# Patient Record
Sex: Male | Born: 1970 | Race: White | Hispanic: No | Marital: Married | Smoking: Current every day smoker
Health system: Southern US, Community
[De-identification: ages and names within clinical notes are randomized; demographics above are authoritative.]

## PROBLEM LIST (undated history)

## (undated) DIAGNOSIS — N200 Calculus of kidney: Secondary | ICD-10-CM

## (undated) DIAGNOSIS — G4733 Obstructive sleep apnea (adult) (pediatric): Secondary | ICD-10-CM

## (undated) DIAGNOSIS — I10 Essential (primary) hypertension: Secondary | ICD-10-CM

## (undated) HISTORY — PX: TONSILLECTOMY AND ADENOIDECTOMY: SHX28

## (undated) HISTORY — DX: Essential (primary) hypertension: I10

## (undated) HISTORY — DX: Obstructive sleep apnea (adult) (pediatric): G47.33

---

## 2003-12-31 ENCOUNTER — Emergency Department (HOSPITAL_COMMUNITY): Admission: EM | Admit: 2003-12-31 | Discharge: 2003-12-31 | Payer: Self-pay | Admitting: Emergency Medicine

## 2005-08-07 ENCOUNTER — Ambulatory Visit: Payer: Self-pay | Admitting: Family Medicine

## 2005-08-07 ENCOUNTER — Ambulatory Visit (HOSPITAL_COMMUNITY): Admission: RE | Admit: 2005-08-07 | Discharge: 2005-08-07 | Payer: Self-pay | Admitting: Family Medicine

## 2005-10-12 ENCOUNTER — Ambulatory Visit: Payer: Self-pay | Admitting: Family Medicine

## 2005-10-20 ENCOUNTER — Ambulatory Visit: Payer: Self-pay | Admitting: Family Medicine

## 2006-05-24 ENCOUNTER — Ambulatory Visit: Payer: Self-pay | Admitting: Cardiovascular Disease

## 2006-05-24 ENCOUNTER — Ambulatory Visit: Payer: Self-pay | Admitting: Family Medicine

## 2006-05-26 ENCOUNTER — Ambulatory Visit (HOSPITAL_COMMUNITY): Admission: RE | Admit: 2006-05-26 | Discharge: 2006-05-27 | Payer: Self-pay | Admitting: Urology

## 2006-07-08 ENCOUNTER — Ambulatory Visit (HOSPITAL_COMMUNITY): Admission: AD | Admit: 2006-07-08 | Discharge: 2006-07-08 | Payer: Self-pay | Admitting: Urology

## 2006-07-14 ENCOUNTER — Observation Stay (HOSPITAL_COMMUNITY): Admission: RE | Admit: 2006-07-14 | Discharge: 2006-07-15 | Payer: Self-pay | Admitting: Urology

## 2007-03-12 ENCOUNTER — Emergency Department (HOSPITAL_COMMUNITY): Admission: EM | Admit: 2007-03-12 | Discharge: 2007-03-12 | Payer: Self-pay | Admitting: Emergency Medicine

## 2007-03-13 ENCOUNTER — Ambulatory Visit: Payer: Self-pay | Admitting: Family Medicine

## 2007-06-23 ENCOUNTER — Ambulatory Visit: Payer: Self-pay | Admitting: Family Medicine

## 2007-06-23 DIAGNOSIS — H9319 Tinnitus, unspecified ear: Secondary | ICD-10-CM | POA: Insufficient documentation

## 2007-06-23 DIAGNOSIS — Z87442 Personal history of urinary calculi: Secondary | ICD-10-CM

## 2007-07-03 ENCOUNTER — Ambulatory Visit: Payer: Self-pay | Admitting: Family Medicine

## 2008-10-10 ENCOUNTER — Ambulatory Visit: Payer: Self-pay | Admitting: Family Medicine

## 2008-10-10 DIAGNOSIS — M79609 Pain in unspecified limb: Secondary | ICD-10-CM

## 2009-01-28 ENCOUNTER — Ambulatory Visit: Payer: Self-pay | Admitting: Family Medicine

## 2009-01-28 DIAGNOSIS — J069 Acute upper respiratory infection, unspecified: Secondary | ICD-10-CM

## 2009-04-21 ENCOUNTER — Emergency Department (HOSPITAL_COMMUNITY): Admission: EM | Admit: 2009-04-21 | Discharge: 2009-04-21 | Payer: Self-pay | Admitting: Emergency Medicine

## 2009-11-06 ENCOUNTER — Encounter (INDEPENDENT_AMBULATORY_CARE_PROVIDER_SITE_OTHER): Payer: Self-pay | Admitting: *Deleted

## 2009-11-29 HISTORY — PX: KIDNEY STONE SURGERY: SHX686

## 2010-01-29 ENCOUNTER — Emergency Department (HOSPITAL_COMMUNITY): Admission: EM | Admit: 2010-01-29 | Discharge: 2010-01-30 | Payer: Self-pay | Admitting: Emergency Medicine

## 2010-06-08 ENCOUNTER — Telehealth: Payer: Self-pay | Admitting: Family Medicine

## 2010-06-22 ENCOUNTER — Ambulatory Visit (HOSPITAL_COMMUNITY): Admission: RE | Admit: 2010-06-22 | Discharge: 2010-06-23 | Payer: Self-pay | Admitting: Urology

## 2010-06-22 ENCOUNTER — Encounter: Payer: Self-pay | Admitting: Urology

## 2010-12-29 NOTE — Progress Notes (Signed)
Summary: Pt has cough, chest congestion, sorethroat. Req med called in  Phone Note Call from Patient Call back at Home Phone [phone]   Caller: Patient Summary of Call: Pt called and said that he has a cough, chest congestion, sorethroat. Pt req an ov or med called in to [facility] on [address] and [address].   Initial call taken by: Lucy Antigua,  June 08, 2010 9:05 AM  Follow-up for Phone Call        Pt called back to ask about prescription to be called to [facility] ([address]) [phone] Follow-up by: Lynann Beaver CMA,  June 08, 2010 4:56 PM  Additional Follow-up for Phone Call Additional follow up Details #1::        call in a [employer] Additional Follow-up by: Nelwyn Salisbury MD,  June 09, 2010 9:41 AM    Additional Follow-up for Phone Call Additional follow up Details #2::    Rx called. Follow-up by: Raechel Ache, RN,  June 09, 2010 9:57 AM

## 2011-02-13 LAB — TYPE AND SCREEN
ABO/RH(D): O NEG
Antibody Screen: NEGATIVE

## 2011-02-13 LAB — BASIC METABOLIC PANEL
BUN: 14 mg/dL (ref 6–23)
CO2: 30 mEq/L (ref 19–32)
Chloride: 99 mEq/L (ref 96–112)
Glucose, Bld: 109 mg/dL — ABNORMAL HIGH (ref 70–99)
Potassium: 2.9 mEq/L — ABNORMAL LOW (ref 3.5–5.1)
Sodium: 138 mEq/L (ref 135–145)

## 2011-02-13 LAB — CBC
HCT: 41.3 % (ref 39.0–52.0)
Hemoglobin: 14.5 g/dL (ref 13.0–17.0)
MCV: 86.6 fL (ref 78.0–100.0)
RBC: 4.77 MIL/uL (ref 4.22–5.81)
WBC: 6.1 10*3/uL (ref 4.0–10.5)

## 2011-02-13 LAB — STONE ANALYSIS: Stone Weight KSTONE: 0.181 g

## 2011-03-09 LAB — URINE MICROSCOPIC-ADD ON

## 2011-03-09 LAB — URINALYSIS, ROUTINE W REFLEX MICROSCOPIC
Glucose, UA: NEGATIVE mg/dL
Ketones, ur: NEGATIVE mg/dL
Leukocytes, UA: NEGATIVE
Protein, ur: NEGATIVE mg/dL
pH: 5.5 (ref 5.0–8.0)

## 2011-04-16 NOTE — Op Note (Signed)
Vincent Grant, Vincent Grant                ACCOUNT NO.:  [id number]   MEDICAL RECORD NO.:  [id number]          PATIENT TYPE:  OBV   LOCATION:  7829                         FACILITY:  [facility]   PHYSICIAN:  Jamison Neighbor, M.D.  DATE OF BIRTH:  [birth year]   DATE OF PROCEDURE:  07/14/2006  DATE OF DISCHARGE:                                 OPERATIVE REPORT   PREOPERATIVE DIAGNOSIS:  Right ureteropelvic junction stone.   POSTOPERATIVE DIAGNOSIS:  Right ureteropelvic junction stone.   PROCEDURE:  1. Cystoscopy.  2. Removal of existing right double-J catheter.  3. Right flexible and rigid ureteroscopy.  4. Right in situ laser lithotripsy.  5. Right retrograde pyelogram.  6. Right double-J catheter.   SURGEON:  Dr. Logan Bores.   ANESTHESIA:  General.   COMPLICATIONS:  None.   DRAINS:  A 5-French by 28 Polaris double-J.   HISTORY:  This 40 year old male had ESWL but did not pass many stone  fragments and became impacted at the UPJ.  Dr. Wanda Plump put a double-J  stent in, which is still in place, but the patient has had some real  problems with bleeding and discomfort from the stent.  He is now to undergo  laser lithotripsy through the ureteroscope.  The patient understands the  risks and benefits of the procedure, and gave full informed consent.   PROCEDURE:  After successful induction of general anesthesia, the patient  was placed in the dorsal lithotomy position, prepped with Betadine, and  draped in the usual sterile fashion.  Cystoscopy was performed.  The stent  was identified.  It was grasped and pulled out.  A guidewire was then passed  through the stent, up into the kidney, where it coiled normally.  A rigid  ureteroscope was advanced along the ureter and came up into the pelvis, but  the stone was seen more towards the lower pole, and it could not be treated  with the rigid ureteroscope.  The rigid ureteroscope was removed.  A  ureteral access sheath was then passed over the  guidewire, using  fluoroscopic guide.  The flexible ureteroscope was passed through, and was  used to maneuver the stone into position where it could be lasered.  The  stone was lasered.  Part of the stone was noted to be impacted under the  mucosa, which is probably the reason the lithotripsy did not work that well.  The flexible ureteroscope was withdrawn.  The rigid ureteroscope was  reinserted, and at this point the stone was easier to see, and it could also  be fragmented.  The pieces turned out to be small enough to pass, and these  stones should be able to pass.  The ureteroscope was removed.  The ureter  was inspected.  It had not been injured.  A guidewire was passed up to the  kidney and a new double-J catheter was inserted.  Because we are going to  leave this in for a while, the patient was given a 5-French by 28 cm Polaris  double-J, which should be much more comfortable than his previous stent.  The  patient received intraoperative BNO  suppository.  The patient tolerated the procedure well and was taken to the  recovery room in good condition.  He will continue on the antibiotics he has  at home.  He will be given a new prescription for Percocet.  We will plan to  see him back in follow up in about two weeks.           ______________________________  Jamison Neighbor, M.D.  Electronically Signed     RJE/MEDQ  D:  07/14/2006  T:  07/14/2006  Job:  [id number]

## 2011-04-16 NOTE — Op Note (Signed)
Vincent Grant, Vincent Grant                ACCOUNT NO.:  [id number]   MEDICAL RECORD NO.:  [id number]          PATIENT TYPE:  AMB   LOCATION:  DAY                          FACILITY:  [facility]   PHYSICIAN:  Boston Service, M.D.DATE OF BIRTH:  [birth year]   DATE OF PROCEDURE:  07/08/2006  DATE OF DISCHARGE:                                 OPERATIVE REPORT   PREOPERATIVE DIAGNOSIS:  A 40 year old male with 13 mm stone, status post  ESWL.  A CT today shows persistent 13 mm calculus at the right UPJ with  right hydronephrosis.  The patient has had 2-3 days of intractable pain with  nausea and no vomiting.   POSTOPERATIVE DIAGNOSIS:  A 40 year old male with 13 mm stone, status post  ESWL.  A CT today shows persistent 13 mm calculus at the right UPJ with  right hydronephrosis.  The patient has had 2-3 days of intractable pain with  nausea and no vomiting.  Marland Kitchen   PROCEDURES:  1. Cystoscopy.  2. Retrograde right double-J stent.   SURGEON:  Boston Service, M.D.   ASSISTANT:  None.   ANESTHESIA:  General.   SPECIMENS:  None.   DRAINS:  A 6-French 28 cm double-J stent.   ESTIMATED BLOOD LOSS:  Minimal.   DESCRIPTION OF PROCEDURE:  The patient was prepped and draped in the  dorsolithotomy position, after institution of an adequate level of general  anesthesia.  A well lubricated 21-French panendoscope was gently inserted at  the urethral meatus; normal urethra and sphincter, nonobstructive prostate.  The bladder was carefully inspected and showed no evidence of tumor, stone,  bleeding site or other anatomic abnormality.  Clear reflux at the left  orifice with minimal reflux at the right orifice.   A blocking catheter was selected, positioned at the left ureteral orifice.  Normal course and caliber of the ureter, pelvis and calyces with injection  of 3-6 cc of contrast.  A similar technique was used on the right side.  The  stone demonstrated to be impacted at the UPJ.  With gentle  injection of  contrast, the stone appeared to be displaced into the lower pole calyces.  A  guidewire was advanced into the upper pole calyces, with prompt efflux of  concentrated urine from the right ureteral orifice.  A 6-French 28 cm double-  J stent was selected, passed over the guidewire; with  excellent pigtail formation on guidewire removal and prompt efflux of  concentrated urine through the fenestration -- so the double-J stent bladder  was drained.  Cystoscope was removed.  The patient was returned to recovery  in satisfactory condition.           ______________________________  [place] Service, M.D.     RH/MEDQ  D:  07/08/2006  T:  07/09/2006  Job:  [id number]   cc:   Jeannett Senior A. Clent Ridges, M.D. [facility]  [address] [place]  [place] [address]   Jamison Neighbor, M.D.  Fax: [phone]

## 2013-09-25 ENCOUNTER — Emergency Department (HOSPITAL_BASED_OUTPATIENT_CLINIC_OR_DEPARTMENT_OTHER)
Admission: EM | Admit: 2013-09-25 | Discharge: 2013-09-25 | Disposition: A | Discharge status: Home / Self Care | Payer: Managed Care, Other (non HMO) | Attending: Emergency Medicine | Admitting: Emergency Medicine

## 2013-09-25 ENCOUNTER — Encounter (HOSPITAL_BASED_OUTPATIENT_CLINIC_OR_DEPARTMENT_OTHER): Payer: Self-pay | Admitting: Emergency Medicine

## 2013-09-25 DIAGNOSIS — H81392 Other peripheral vertigo, left ear: Secondary | ICD-10-CM

## 2013-09-25 DIAGNOSIS — R42 Dizziness and giddiness: Secondary | ICD-10-CM | POA: Insufficient documentation

## 2013-09-25 DIAGNOSIS — H9319 Tinnitus, unspecified ear: Secondary | ICD-10-CM | POA: Insufficient documentation

## 2013-09-25 DIAGNOSIS — R112 Nausea with vomiting, unspecified: Secondary | ICD-10-CM | POA: Insufficient documentation

## 2013-09-25 DIAGNOSIS — Z87442 Personal history of urinary calculi: Secondary | ICD-10-CM | POA: Insufficient documentation

## 2013-09-25 HISTORY — DX: Calculus of kidney: N20.0

## 2013-09-25 MED ORDER — ONDANSETRON HCL 4 MG/2ML IJ SOLN
INTRAMUSCULAR | Status: AC
  Administered 2013-09-25: 4 mg
  Filled 2013-09-25: qty 2

## 2013-09-25 MED ORDER — MECLIZINE HCL 25 MG PO TABS
25.0000 mg | ORAL_TABLET | Freq: Once | ORAL | Status: AC
  Administered 2013-09-25: 25 mg via ORAL
  Filled 2013-09-25: qty 1

## 2013-09-25 MED ORDER — ONDANSETRON 8 MG PO TBDP
ORAL_TABLET | ORAL | Status: AC
  Administered 2013-09-25: 8 mg
  Filled 2013-09-25: qty 1

## 2013-09-25 MED ORDER — MECLIZINE HCL 50 MG PO TABS: 25.0000 mg | ORAL_TABLET | Freq: Three times a day (TID) | ORAL | Status: DC | PRN

## 2013-09-25 MED ORDER — ONDANSETRON HCL 4 MG PO TABS: 4.0000 mg | ORAL_TABLET | Freq: Four times a day (QID) | ORAL | Status: DC

## 2013-09-25 NOTE — ED Provider Notes (Signed)
CSN: [id number]     Arrival date & time 09/25/13  1024 History   First MD Initiated Contact with Patient 09/25/13 1100     Chief Complaint  Patient presents with  . Emesis  . Dizziness   (Consider location/radiation/quality/duration/timing/severity/associated sxs/prior Treatment) HPI Comments: Pt is a 42 y.o. male with Pmhx as above who presents with sudden onset vertigo while at work this morning.  States he has been having mild tinnitus for a few days, but was much worse just prior to onset of vertigo along w/ muffled L sided hearing today.  No fever's, h/a, numbness, weakness, visual changes.  Patient is a 42 y.o. male presenting with neurologic complaint. The history is provided by the patient. No language interpreter was used.  Neurologic Problem This is a new problem. The current episode started less than 1 hour ago. The problem occurs constantly. The problem has not changed since onset.Pertinent negatives include no chest pain, no abdominal pain, no headaches and no shortness of breath. Exacerbated by: movement, turning head. Relieved by: being still. He has tried nothing for the symptoms. The treatment provided no relief.    Past Medical History  Diagnosis Date  . Kidney stones    History reviewed. No pertinent past surgical history. History reviewed. No pertinent family history. History  Substance Use Topics  . Smoking status: Never Smoker   . Smokeless tobacco: Not on file  . Alcohol Use: No    Review of Systems  Constitutional: Negative for fever, activity change, appetite change and fatigue.  HENT: Negative for congestion, facial swelling, rhinorrhea and trouble swallowing.   Eyes: Negative for photophobia and pain.  Respiratory: Negative for cough, chest tightness and shortness of breath.   Cardiovascular: Negative for chest pain and leg swelling.  Gastrointestinal: Positive for nausea and vomiting. Negative for abdominal pain, diarrhea and constipation.  Endocrine:  Negative for polydipsia and polyuria.  Genitourinary: Negative for dysuria, urgency, decreased urine volume and difficulty urinating.  Musculoskeletal: Negative for back pain and gait problem.  Skin: Negative for color change, rash and wound.  Allergic/Immunologic: Negative for immunocompromised state.  Neurological: Positive for dizziness. Negative for facial asymmetry, speech difficulty, weakness, numbness and headaches.  Psychiatric/Behavioral: Negative for confusion, decreased concentration and agitation.    Allergies  Review of patient's allergies indicates no known allergies.  Home Medications   Current Outpatient Rx  Name  Route  Sig  Dispense  Refill  . meclizine (ANTIVERT) 50 MG tablet   Oral   Take 0.5 tablets (25 mg total) by mouth 3 (three) times daily as needed for dizziness.   15 tablet   0   . ondansetron (ZOFRAN) 4 MG tablet   Oral   Take 1 tablet (4 mg total) by mouth every 6 (six) hours.   15 tablet   0    BP 133/87  Pulse 75  Temp(Src) 98.9 F (37.2 C) (Oral)  Resp 20  Ht 6\' 3"  (1.905 m)  Wt 280 lb (127.007 kg)  BMI 35 kg/m2  SpO2 100% Physical Exam  Constitutional: He is oriented to person, place, and time. He appears well-developed and well-nourished. No distress.  HENT:  Head: Normocephalic and atraumatic.  Mouth/Throat: No oropharyngeal exudate.  Eyes: Pupils are equal, round, and reactive to light.  Horizontal nystagmus  Neck: Normal range of motion. Neck supple.  Cardiovascular: Normal rate, regular rhythm and normal heart sounds.  Exam reveals no gallop and no friction rub.   No murmur heard. Pulmonary/Chest: Effort normal and  breath sounds normal. No respiratory distress. He has no wheezes. He has no rales.  Abdominal: Soft. Bowel sounds are normal. He exhibits no distension and no mass. There is no tenderness. There is no rebound and no guarding.  Musculoskeletal: Normal range of motion. He exhibits no edema and no tenderness.   Neurological: He is alert and oriented to person, place, and time. He has normal strength. He displays no tremor. No cranial nerve deficit or sensory deficit. He exhibits normal muscle tone. He displays a negative Romberg sign. Coordination and gait normal. GCS eye subscore is 4. GCS verbal subscore is 5. GCS motor subscore is 6.  +dix-halpike towards R  Skin: Skin is warm and dry.  Psychiatric: He has a normal mood and affect.    ED Course  Procedures (including critical care time) Labs Review Labs Reviewed - No data to display Imaging Review No results found.  EKG Interpretation     Ventricular Rate:  68 PR Interval:  158 QRS Duration: 94 QT Interval:  430 QTC Calculation: 457 R Axis:   38 Text Interpretation:  Normal sinus rhythm Normal ECG            MDM   1. Peripheral vertigo involving left ear    Pt is a 42 y.o. male with Pmhx as above who presents with sudden onset vertigo.  States he has been having mild tinnitus, but was much worse just prior to onset of vertigo along w/ muffled L sided hearing today.  On PE, VSS, pt in NAD.  He has horizontal nystagmus,  Worsening of symptoms w/ mvmt R sided dix halpike positive.  Epley maneuver preformed.  No other focal neuro findings.  Strongly suspect peripheral vertigo such as BPPV, labyrinthitis or meniere's disease.  Doubt CVA/TIA, incracranial bleed. Symptoms much improved after 1 dose zofran & meclizine.  Will d/c hoem w/ same, recommend outpt ENT f/u.         Shanna Cisco, MD 09/26/13 1018

## 2013-09-25 NOTE — ED Notes (Signed)
Per EMS:  Pt from work, started having dizziness while sitting at work.  Reports N/V that started after that.  Now reports right sided abdominal cramping.  Pt ambulatory.  Denies CP.

## 2016-01-13 ENCOUNTER — Ambulatory Visit (INDEPENDENT_AMBULATORY_CARE_PROVIDER_SITE_OTHER): Payer: Managed Care, Other (non HMO) | Admitting: Family Medicine

## 2016-01-13 ENCOUNTER — Encounter: Payer: Self-pay | Admitting: Family Medicine

## 2016-01-13 VITALS — BP 146/98 | HR 76 | Temp 98.5°F | Ht 75.0 in | Wt 275.0 lb

## 2016-01-13 DIAGNOSIS — N200 Calculus of kidney: Secondary | ICD-10-CM

## 2016-01-13 DIAGNOSIS — R109 Unspecified abdominal pain: Secondary | ICD-10-CM | POA: Diagnosis not present

## 2016-01-13 LAB — HEPATIC FUNCTION PANEL
ALK PHOS: 83 U/L (ref 39–117)
ALT: 25 U/L (ref 0–53)
AST: 26 U/L (ref 0–37)
Albumin: 4.7 g/dL (ref 3.5–5.2)
BILIRUBIN TOTAL: 0.6 mg/dL (ref 0.2–1.2)
Bilirubin, Direct: 0.1 mg/dL (ref 0.0–0.3)
Total Protein: 7.4 g/dL (ref 6.0–8.3)

## 2016-01-13 LAB — POC URINALSYSI DIPSTICK (AUTOMATED)
BILIRUBIN UA: NEGATIVE
Glucose, UA: NEGATIVE
Ketones, UA: NEGATIVE
Leukocytes, UA: NEGATIVE
Nitrite, UA: NEGATIVE
PROTEIN UA: NEGATIVE
SPEC GRAV UA: 1.025
Urobilinogen, UA: 0.2
pH, UA: 6

## 2016-01-13 LAB — BASIC METABOLIC PANEL
BUN: 15 mg/dL (ref 6–23)
CO2: 31 mEq/L (ref 19–32)
Calcium: 9.5 mg/dL (ref 8.4–10.5)
Chloride: 103 mEq/L (ref 96–112)
Creatinine, Ser: 0.99 mg/dL (ref 0.40–1.50)
GFR: 86.32 mL/min (ref >60.00)
Glucose, Bld: 103 mg/dL — ABNORMAL HIGH (ref 70–99)
Potassium: 4.2 mEq/L (ref 3.5–5.1)
SODIUM: 141 meq/L (ref 135–145)

## 2016-01-13 LAB — CBC WITH DIFFERENTIAL/PLATELET
Basophils Absolute: 0 10*3/uL (ref 0.0–0.1)
Basophils Relative: 0.5 % (ref 0.0–3.0)
EOS PCT: 1.5 % (ref 0.0–5.0)
Eosinophils Absolute: 0.1 10*3/uL (ref 0.0–0.7)
HCT: 45.8 % (ref 39.0–52.0)
Hemoglobin: 15.9 g/dL (ref 13.0–17.0)
LYMPHS ABS: 2 10*3/uL (ref 0.7–4.0)
Lymphocytes Relative: 34.7 % (ref 12.0–46.0)
MCHC: 34.7 g/dL (ref 30.0–36.0)
MCV: 84.7 fl (ref 78.0–100.0)
MONO ABS: 0.4 10*3/uL (ref 0.1–1.0)
Monocytes Relative: 7.5 % (ref 3.0–12.0)
NEUTROS PCT: 55.8 % (ref 43.0–77.0)
Neutro Abs: 3.2 10*3/uL (ref 1.4–7.7)
Platelets: 178 10*3/uL (ref 150.0–400.0)
RBC: 5.41 Mil/uL (ref 4.22–5.81)
RDW: 13.3 % (ref 11.5–15.5)
WBC: 5.7 10*3/uL (ref 4.0–10.5)

## 2016-01-13 NOTE — Addendum Note (Signed)
Addended by: Aggie Hacker A on: 01/13/2016 09:29 AM   Modules accepted: Orders

## 2016-01-13 NOTE — Progress Notes (Signed)
   Subjective:    Patient ID: Vincent Grant, male    DOB: [birth year], 45 y.o.   MRN: [id number]  HPI Here for a 5 month hx of intermittent dull aching pains in the right middle back which radiate around the right flank and down into the right groin area. These may last minutes or hours at a time. They are annoying but not severe. He sometimes feels the need to urinate frequently but there is no burning. No blood in the urine. His BMs are regular. No nausea or fever. Eating food does not seem to affect the pain. Of note he has a hx of numerous kidney stones. He has had a urostomy on the right side wit stent placement, and he has had lithotripsy several times.    Review of Systems  Constitutional: Negative.   Respiratory: Negative.   Cardiovascular: Negative.   Gastrointestinal: Positive for abdominal pain. Negative for nausea, vomiting, diarrhea, constipation, blood in stool, abdominal distention, anal bleeding and rectal pain.  Genitourinary: Positive for urgency, frequency and flank pain. Negative for dysuria, hematuria, discharge, difficulty urinating and testicular pain.       Objective:   Physical Exam  Constitutional: He is oriented to person, place, and time. He appears well-developed and well-nourished. No distress.  Neck: No thyromegaly present.  Cardiovascular: Normal rate, regular rhythm, normal heart sounds and intact distal pulses.   Pulmonary/Chest: Effort normal and breath sounds normal.  Abdominal: Soft. Bowel sounds are normal. He exhibits no distension and no mass. There is no rebound and no guarding.  Mildly tender in the right flank and the RLQ  Genitourinary: Rectum normal, prostate normal and penis normal. No penile tenderness.  Musculoskeletal: He exhibits no edema.  Lymphadenopathy:    He has no cervical adenopathy.  Neurological: He is alert and oriented to person, place, and time.          Assessment & Plan:  Right flank pain with urinary urgency and  hematuria. This is likely due to a renal stone. We will get labs today including a CBC and BMET., and we will set up a CT of the abdomen and pelvis. Advised him to drink plenty of water.

## 2016-01-13 NOTE — Progress Notes (Signed)
Pre visit review using our clinic review tool, if applicable. No additional management support is needed unless otherwise documented below in the visit note. 

## 2016-01-14 ENCOUNTER — Ambulatory Visit (INDEPENDENT_AMBULATORY_CARE_PROVIDER_SITE_OTHER)
Admission: RE | Admit: 2016-01-14 | Discharge: 2016-01-14 | Disposition: A | Discharge status: Home / Self Care | Payer: Managed Care, Other (non HMO) | Source: Ambulatory Visit | Attending: Family Medicine | Admitting: Family Medicine

## 2016-01-14 DIAGNOSIS — R109 Unspecified abdominal pain: Secondary | ICD-10-CM | POA: Diagnosis not present

## 2016-01-14 MED ORDER — IOHEXOL 300 MG/ML  SOLN
100.0000 mL | Freq: Once | INTRAMUSCULAR | Status: AC | PRN
  Administered 2016-01-14: 100 mL via INTRAVENOUS

## 2016-01-15 LAB — URINE CULTURE
Colony Count: NO GROWTH
ORGANISM ID, BACTERIA: NO GROWTH

## 2016-01-15 NOTE — Addendum Note (Signed)
Addended by: Alysia Penna A on: 01/15/2016 08:35 AM   Modules accepted: Orders

## 2016-01-26 ENCOUNTER — Encounter: Payer: Self-pay | Admitting: Family Medicine

## 2016-01-30 ENCOUNTER — Other Ambulatory Visit: Payer: Self-pay | Admitting: Urology

## 2016-02-03 ENCOUNTER — Encounter (HOSPITAL_COMMUNITY): Payer: Self-pay | Admitting: General Practice

## 2016-02-05 ENCOUNTER — Ambulatory Visit (HOSPITAL_COMMUNITY): Payer: Managed Care, Other (non HMO)

## 2016-02-05 ENCOUNTER — Ambulatory Visit (HOSPITAL_COMMUNITY)
Admission: RE | Admit: 2016-02-05 | Discharge: 2016-02-05 | Disposition: A | Discharge status: Home / Self Care | Payer: Managed Care, Other (non HMO) | Source: Ambulatory Visit | Attending: Urology | Admitting: Urology

## 2016-02-05 ENCOUNTER — Encounter (HOSPITAL_COMMUNITY): Payer: Self-pay

## 2016-02-05 ENCOUNTER — Encounter (HOSPITAL_COMMUNITY)
Admission: RE | Disposition: A | Discharge status: Home / Self Care | Payer: Self-pay | Source: Ambulatory Visit | Attending: Urology

## 2016-02-05 DIAGNOSIS — Z841 Family history of disorders of kidney and ureter: Secondary | ICD-10-CM | POA: Insufficient documentation

## 2016-02-05 DIAGNOSIS — F17200 Nicotine dependence, unspecified, uncomplicated: Secondary | ICD-10-CM | POA: Diagnosis not present

## 2016-02-05 DIAGNOSIS — R109 Unspecified abdominal pain: Secondary | ICD-10-CM | POA: Diagnosis present

## 2016-02-05 DIAGNOSIS — N200 Calculus of kidney: Secondary | ICD-10-CM | POA: Diagnosis not present

## 2016-02-05 DIAGNOSIS — Z87442 Personal history of urinary calculi: Secondary | ICD-10-CM | POA: Diagnosis not present

## 2016-02-05 SURGERY — LITHOTRIPSY, ESWL
Anesthesia: LOCAL | Laterality: Right

## 2016-02-05 MED ORDER — DIAZEPAM 5 MG PO TABS
10.0000 mg | ORAL_TABLET | ORAL | Status: AC
  Administered 2016-02-05: 10 mg via ORAL
  Filled 2016-02-05: qty 2

## 2016-02-05 MED ORDER — SODIUM CHLORIDE 0.9 % IV SOLN
INTRAVENOUS | Status: DC
  Administered 2016-02-05: 07:00:00 via INTRAVENOUS

## 2016-02-05 MED ORDER — CIPROFLOXACIN HCL 500 MG PO TABS
500.0000 mg | ORAL_TABLET | ORAL | Status: AC
  Administered 2016-02-05: 500 mg via ORAL
  Filled 2016-02-05: qty 1

## 2016-02-05 MED ORDER — DIPHENHYDRAMINE HCL 25 MG PO CAPS
25.0000 mg | ORAL_CAPSULE | ORAL | Status: AC
  Administered 2016-02-05: 25 mg via ORAL
  Filled 2016-02-05: qty 1

## 2016-02-05 NOTE — Interval H&P Note (Signed)
History and Physical Interval Note:  02/05/2016 8:56 AM  Vincent Grant  has presented today for surgery, with the diagnosis of right renal calculi  The various methods of treatment have been discussed with the patient and family. After consideration of risks, benefits and other options for treatment, the patient has consented to  Procedure(s): RIGHT EXTRACORPOREAL SHOCK WAVE LITHOTRIPSY (ESWL) (Right) as a surgical intervention .  The patient's history has been reviewed, patient examined, no change in status, stable for surgery.  I have reviewed the patient's chart and labs.  Questions were answered to the patient's satisfaction.     Drayk Humbarger I Nicolis Boody

## 2016-02-05 NOTE — Discharge Instructions (Signed)
Dietary Guidelines to Help Prevent Kidney Stones Your risk of kidney stones can be decreased by adjusting the foods you eat. The most important thing you can do is drink enough fluid. You should drink enough fluid to keep your urine clear or pale yellow. The following guidelines provide specific information for the type of kidney stone you have had. GUIDELINES ACCORDING TO TYPE OF KIDNEY STONE Calcium Oxalate Kidney Stones  Reduce the amount of salt you eat. Foods that have a lot of salt cause your body to release excess calcium into your urine. The excess calcium can combine with a substance called oxalate to form kidney stones.  Reduce the amount of animal protein you eat if the amount you eat is excessive. Animal protein causes your body to release excess calcium into your urine. Ask your dietitian how much protein from animal sources you should be eating.  Avoid foods that are high in oxalates. If you take vitamins, they should have less than 500 mg of vitamin C. Your body turns vitamin C into oxalates. You do not need to avoid fruits and vegetables high in vitamin C. Calcium Phosphate Kidney Stones  Reduce the amount of salt you eat to help prevent the release of excess calcium into your urine.  Reduce the amount of animal protein you eat if the amount you eat is excessive. Animal protein causes your body to release excess calcium into your urine. Ask your dietitian how much protein from animal sources you should be eating.  Get enough calcium from food or take a calcium supplement (ask your dietitian for recommendations). Food sources of calcium that do not increase your risk of kidney stones include:  Broccoli.  Dairy products, such as cheese and yogurt.  Pudding. Uric Acid Kidney Stones  Do not have more than 6 oz of animal protein per day. FOOD SOURCES Animal Protein Sources  Meat (all types).  Poultry.  Eggs.  Fish, seafood. Foods High in Salt  Salt seasonings.  Soy  sauce.  Teriyaki sauce.  Cured and processed meats.  Salted crackers and snack foods.  Fast food.  Canned soups and most canned foods. Foods High in Oxalates  Grains:  Amaranth.  Barley.  Grits.  Wheat germ.  Bran.  Buckwheat flour.  All bran cereals.  Pretzels.  Whole wheat bread.  Vegetables:  Beans (wax).  Beets and beet greens.  Collard greens.  Eggplant.  Escarole.  Leeks.  Okra.  Parsley.  Rutabagas.  Spinach.  Swiss chard.  Tomato paste.  Fried potatoes.  Sweet potatoes.  Fruits:  Red currants.  Figs.  Kiwi.  Rhubarb.  Meat and Other Protein Sources:  Beans (dried).  Soy burgers and other soybean products.  Miso.  Nuts (peanuts, almonds, pecans, cashews, hazelnuts).  Nut butters.  Sesame seeds and tahini (paste made of sesame seeds).  Poppy seeds.  Beverages:  Chocolate drink mixes.  Soy milk.  Instant iced tea.  Juices made from high-oxalate fruits or vegetables.  Other:  Carob.  Chocolate.  Fruitcake.  Marmalades.   This information is not intended to replace advice given to you by your health care provider. Make sure you discuss any questions you have with your health care provider.   Document Released: 03/12/2011 Document Revised: 11/20/2013 Document Reviewed: 10/12/2013 Elsevier Interactive Patient Education 2016 [employer]  

## 2016-02-05 NOTE — H&P (Signed)
History of Present Illness Last seen in December 2014. He is recently evaluated by his primary care physician for right flank pain. CT scan of the abdomen and pelvis with contrast indicated a right-sided renal collecting system calculi that could not be r/o fed intermittently obstructing measuring up to 12 mm. He has previously been treated for uric acid stone with potassium citrate, allopurinol, and HCTZ. PSH is also significant for PCNL for right sided stone.       He's only had mild to moderate pain controlled with increased water drinking and ibuprofen. No debilitating renal colic. No changes in lower urinary tract symptoms including hematuria or dysuria. He denies passing a kidney stone since we last evaluated him. He has had undisclosed issues with his son and so has not really been taking care of himself in the last 2-1/2 years. He is no longer taking allopurinol, potassium citrate, or HCTZ. He does endorse having some gout-like pain in his thumb and great toe.   Past Medical History Problems  1. History of Gout (M10.9) 2. History of backache (Z87.39) 3. 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 History of Urinary Calculus On The Right  Surgical History Problems  1. History of Cystoscopy With Ureteroscopy 2. History of Kidney Surgery 3. History of Percutaneous Lithotomy For Stone Over 2cm. 4. History of Renal Lithotripsy  Current Meds 1. Ibuprofen 200 MG Oral Capsule;  Therapy: (Recorded:01Mar2017) to Recorded  Allergies Medication  1. No Known Drug Allergies  Family History Problems  1. Family history of Gout : Father 2. Family history of Nephrolithiasis : Father 3. Family history of Nephrolithiasis  Social History Problems  1. Alcohol Use (History)   2 per week 2. Caffeine Use   2-4 3. Current every day smoker (F17.200) 4. Marital History - Currently Married 5. Occupation:   Optometrist 6. Tobacco Use   1 pack, 20 years  Vitals Vital Signs [Data Includes:  Last 1 Day]  Recorded: [id number] 10:45AM  Height: 6 ft 3 in Weight: 275 lb  BMI Calculated: 34.37 BSA Calculated: 2.51 Blood Pressure: 152 / 99 Temperature: 98.7 F Heart Rate: 89  Physical Exam Constitutional: Well nourished and well developed . No acute distress.  ENT:. The ears and nose are normal in appearance.  Neck: The appearance of the neck is normal and no neck mass is present.  Pulmonary: No respiratory distress and normal respiratory rhythm and effort.  Cardiovascular: Heart rate and rhythm are normal . No peripheral edema.  Abdomen: The abdomen is soft and nontender. No masses are palpated. No CVA tenderness. No hernias are palpable. No hepatosplenomegaly noted.  Genitourinary: Examination of the penis demonstrates no discharge, no masses, no lesions and a normal meatus. The scrotum is without lesions. The right epididymis is palpably normal and non-tender. The left epididymis is palpably normal and non-tender. The right testis is non-tender and without masses. The left testis is non-tender and without masses.  Lymphatics: The femoral and inguinal nodes are not enlarged or tender.  Skin: Normal skin turgor, no visible rash and no visible skin lesions.  Neuro/Psych:. Mood and affect are appropriate.    Results/Data Urine [Data Includes: Last 1 Day]   [id number]  COLOR YELLOW   APPEARANCE CLEAR   SPECIFIC GRAVITY 1.025   pH 5.5   GLUCOSE NEGATIVE   BILIRUBIN NEGATIVE   KETONE NEGATIVE   BLOOD 1+   PROTEIN NEGATIVE   NITRITE NEGATIVE   LEUKOCYTE ESTERASE NEGATIVE   SQUAMOUS EPITHELIAL/HPF NONE SEEN HPF  WBC NONE  SEEN WBC/HPF  RBC 10-20 RBC/HPF  BACTERIA NONE SEEN HPF  CRYSTALS NONE SEEN HPF  CASTS NONE SEEN LPF  Yeast NONE SEEN HPF   Old records or history reviewed: CT imaging noted from mid February. There was no hydronephrosis or ureteral dilation. I measured the right sided stone's HU's to be between [phone] with density as low as 300 around the edges.  The  following images/tracing/specimen were independently visualized:  KUB: There is a right mid renal calculus consistent with the same stone identified on previous CT scan. I measured the calculus between 8 and 9 mm across. There were no obvious ureteral stones. The left renal shadow appeared clear. There is some bony outcroppings in the pelvis that appeared stable. Bowel gas pattern also was normal.  The following clinical lab reports were reviewed:  10-20 rbc/hpf on UA.    Assessment Assessed  1. Renal calculus, right (N20.0)  Plan Health Maintenance  1. UA With REFLEX; [Do Not Release]; Status:Complete;   DonePA:[id number] 10:33AM Nephrolithiasis  2. KUB; Status:Complete;   DonePA:[id number] 11:00AM Renal calculus, right  3. Start: Allopurinol 300 MG Oral Tablet; TAKE 1 TABLET DAILY 4. Start: Meloxicam 15 MG Oral Tablet; TAKE 1 TABLET DAILY AS NEEDED 5. Start: TraMADol HCl - 50 MG Oral Tablet; TAKE 1 OR 2 TABLETS BY MOUTH EVERY 4 TO  6 HOURS AS NEEDED 6. Follow-up Schedule Surgery Office  I will task Dr Jeffie Pollock before giving a green sheet to  the schedulers.  Status: Hold For - Appointment  Requested for: [id number]  Discussion/Summary He is quite familiar with the procedures involved with the treatment of ureteral calculi. I am going to send the past to Dr Jeffie Pollock to seek his advice regarding management of the calculus which may include lithotripsy or ureteroscopy. I'll follow up with the patient after that and post a scheduling sheet to Dr Ralene Muskrat scheduler.    I'll refill his prescription of allopurinol and also provide a prescription for meloxicam and tramadol for better pain management. Recommended he continue to monitor himself for worsening symptoms including worsening voiding symptoms while remaining well hydrated and nourished. He will most definitely benefit from a repeat 24-hour urinalysis and serum metabolic evaluation in the future for prevention of stone recurrence.      Signatures Electronically signed by : Jiles Crocker, Trey Paula; Jan 28 2016 12:50PM EST

## 2018-03-06 ENCOUNTER — Encounter: Payer: Self-pay | Admitting: Family Medicine

## 2018-03-06 ENCOUNTER — Ambulatory Visit (INDEPENDENT_AMBULATORY_CARE_PROVIDER_SITE_OTHER): Payer: Managed Care, Other (non HMO) | Admitting: Family Medicine

## 2018-03-06 VITALS — BP 124/70 | HR 97 | Temp 98.4°F | Ht 73.0 in | Wt 271.8 lb

## 2018-03-06 DIAGNOSIS — Z0000 Encounter for general adult medical examination without abnormal findings: Secondary | ICD-10-CM | POA: Diagnosis not present

## 2018-03-06 DIAGNOSIS — Z23 Encounter for immunization: Secondary | ICD-10-CM

## 2018-03-06 LAB — POC URINALSYSI DIPSTICK (AUTOMATED)
Bilirubin, UA: NEGATIVE
GLUCOSE UA: NEGATIVE
Ketones, UA: NEGATIVE
Leukocytes, UA: NEGATIVE
NITRITE UA: NEGATIVE
Protein, UA: NEGATIVE
RBC UA: NEGATIVE
Spec Grav, UA: >=1.03 — AB (ref 1.010–1.025)
Urobilinogen, UA: 0.2 E.U./dL
pH, UA: 5.5 (ref 5.0–8.0)

## 2018-03-06 LAB — CBC WITH DIFFERENTIAL/PLATELET
BASOS PCT: 0.7 % (ref 0.0–3.0)
Basophils Absolute: 0 10*3/uL (ref 0.0–0.1)
EOS ABS: 0.1 10*3/uL (ref 0.0–0.7)
Eosinophils Relative: 1.8 % (ref 0.0–5.0)
HCT: 43.6 % (ref 39.0–52.0)
Hemoglobin: 15.4 g/dL (ref 13.0–17.0)
LYMPHS PCT: 37.7 % (ref 12.0–46.0)
Lymphs Abs: 2.3 10*3/uL (ref 0.7–4.0)
MCHC: 35.3 g/dL (ref 30.0–36.0)
MCV: 86.2 fl (ref 78.0–100.0)
MONOS PCT: 8 % (ref 3.0–12.0)
Monocytes Absolute: 0.5 10*3/uL (ref 0.1–1.0)
NEUTROS ABS: 3.2 10*3/uL (ref 1.4–7.7)
Neutrophils Relative %: 51.8 % (ref 43.0–77.0)
PLATELETS: 177 10*3/uL (ref 150.0–400.0)
RBC: 5.06 Mil/uL (ref 4.22–5.81)
RDW: 13.3 % (ref 11.5–15.5)
WBC: 6.1 10*3/uL (ref 4.0–10.5)

## 2018-03-06 LAB — LIPID PANEL
Cholesterol: 170 mg/dL (ref 0–200)
HDL: 27.7 mg/dL — ABNORMAL LOW (ref >39.00)
NonHDL: 142.15
Total CHOL/HDL Ratio: 6
Triglycerides: 353 mg/dL — ABNORMAL HIGH (ref 0.0–149.0)
VLDL: 70.6 mg/dL — AB (ref 0.0–40.0)

## 2018-03-06 LAB — HEPATIC FUNCTION PANEL
ALT: 24 U/L (ref 0–53)
AST: 22 U/L (ref 0–37)
Albumin: 4.5 g/dL (ref 3.5–5.2)
Alkaline Phosphatase: 76 U/L (ref 39–117)
Bilirubin, Direct: 0.1 mg/dL (ref 0.0–0.3)
Total Bilirubin: 0.5 mg/dL (ref 0.2–1.2)
Total Protein: 7.2 g/dL (ref 6.0–8.3)

## 2018-03-06 LAB — BASIC METABOLIC PANEL
BUN: 16 mg/dL (ref 6–23)
CO2: 31 mEq/L (ref 19–32)
Calcium: 9.4 mg/dL (ref 8.4–10.5)
Chloride: 105 mEq/L (ref 96–112)
Creatinine, Ser: 1.14 mg/dL (ref 0.40–1.50)
GFR: 72.69 mL/min (ref >60.00)
Glucose, Bld: 86 mg/dL (ref 70–99)
Potassium: 3.6 mEq/L (ref 3.5–5.1)
SODIUM: 142 meq/L (ref 135–145)

## 2018-03-06 LAB — LDL CHOLESTEROL, DIRECT: Direct LDL: 99 mg/dL

## 2018-03-06 LAB — TSH: TSH: 0.53 u[IU]/mL (ref 0.35–4.50)

## 2018-03-06 NOTE — Progress Notes (Signed)
   Subjective:    Patient ID: Vincent Grant, male    DOB: [birth year], 47 y.o.   MRN: [id number]  HPI Here for a well exam. He feels fine.    Review of Systems  Constitutional: Negative.   HENT: Negative.   Eyes: Negative.   Respiratory: Negative.   Cardiovascular: Negative.   Gastrointestinal: Negative.   Genitourinary: Negative.   Musculoskeletal: Negative.   Skin: Negative.   Neurological: Negative.   Psychiatric/Behavioral: Negative.        Objective:   Physical Exam  Constitutional: He is oriented to person, place, and time. He appears well-developed and well-nourished. No distress.  HENT:  Head: Normocephalic and atraumatic.  Right Ear: External ear normal.  Left Ear: External ear normal.  Nose: Nose normal.  Mouth/Throat: Oropharynx is clear and moist. No oropharyngeal exudate.  Eyes: Pupils are equal, round, and reactive to light. Conjunctivae and EOM are normal. Right eye exhibits no discharge. Left eye exhibits no discharge. No scleral icterus.  Neck: Neck supple. No JVD present. No tracheal deviation present. No thyromegaly present.  Cardiovascular: Normal rate, regular rhythm, normal heart sounds and intact distal pulses. Exam reveals no gallop and no friction rub.  No murmur heard. Pulmonary/Chest: Effort normal and breath sounds normal. No respiratory distress. He has no wheezes. He has no rales. He exhibits no tenderness.  Abdominal: Soft. Bowel sounds are normal. He exhibits no distension and no mass. There is no tenderness. There is no rebound and no guarding.  Genitourinary: Rectum normal, prostate normal and penis normal. Rectal exam shows guaiac negative stool. No penile tenderness.  Musculoskeletal: Normal range of motion. He exhibits no edema or tenderness.  Lymphadenopathy:    He has no cervical adenopathy.  Neurological: He is alert and oriented to person, place, and time. He has normal reflexes. No cranial nerve deficit. He exhibits normal muscle  tone. Coordination normal.  Skin: Skin is warm and dry. No rash noted. He is not diaphoretic. No erythema. No pallor.  Psychiatric: He has a normal mood and affect. His behavior is normal. Judgment and thought content normal.          Assessment & Plan:  Well exam. We discussed diet and exercise. Get fasting labs. Alysia Penna, MD

## 2018-03-09 ENCOUNTER — Telehealth: Payer: Self-pay | Admitting: Family Medicine

## 2018-03-09 NOTE — Telephone Encounter (Signed)
Pt given results and documented in result note 

## 2018-03-09 NOTE — Telephone Encounter (Signed)
Copied from [place]. Topic: Quick Communication - Lab Results >> Mar 08, 2018  6:11 PM Lamarr Lulas, CMA wrote: Called patient to inform them of  lab results. When patient returns call, triage nurse may disclose results.   Pt calling back for lab results.  Please call on [phone]

## 2018-04-28 ENCOUNTER — Encounter: Payer: Self-pay | Admitting: Family Medicine

## 2018-04-28 ENCOUNTER — Ambulatory Visit (INDEPENDENT_AMBULATORY_CARE_PROVIDER_SITE_OTHER): Payer: Managed Care, Other (non HMO) | Admitting: Family Medicine

## 2018-04-28 VITALS — BP 110/84 | HR 82 | Temp 98.1°F | Ht 73.0 in | Wt 270.6 lb

## 2018-04-28 DIAGNOSIS — R200 Anesthesia of skin: Secondary | ICD-10-CM | POA: Diagnosis not present

## 2018-04-28 DIAGNOSIS — M546 Pain in thoracic spine: Secondary | ICD-10-CM

## 2018-04-28 NOTE — Progress Notes (Signed)
   Subjective:    Patient ID: Vincent Grant, male    DOB: Mar 10, 1971, 47 y.o.   MRN: [id number]  HPI Here for2 issues. First he began to noticed numbness in the left thumb and index finger about 10 days ago. No pain or weakness. No neck trouble. 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 Second about 4 days ago he began to have an intermittent sharp pain in the left central back area. These come up suddenly and last only a few seconds. They are not affected by positional changes. As we speak this pain is absent. He notes that used used to work out regularly with both an Agricultural consultant and with weights, but he stopped this a few months ago.  Review of Systems  Constitutional: Negative.   Respiratory: Negative.   Cardiovascular: Negative.   Musculoskeletal: Positive for back pain.  Neurological: Positive for numbness. Negative for weakness.       Objective:   Physical Exam  Constitutional: He appears well-developed and well-nourished.  Cardiovascular: Normal rate, regular rhythm, normal heart sounds and intact distal pulses.  Pulmonary/Chest: Effort normal and breath sounds normal.  Musculoskeletal:  His back is normal on exam, including the left thoracic area. No spasm or tenderness. Full ROM. The left hand is also normal on exam.           Assessment & Plan:  He seems to have 2 different problems. He likely has some early carpal tunnel syndrome in the hand, and I advised him to avoid using his computer at home as much as possible to rest it. He will wear a wrist splint to bed each night. The other issue seems to be some muscle spasms in the back. I advised him to get back on his exercise routine to add strength and flexibility to the spine. He will start taking a B complex vitamin daily.  Recheck prn. Alysia Penna, MD

## 2020-07-21 ENCOUNTER — Other Ambulatory Visit: Payer: Self-pay

## 2020-07-21 ENCOUNTER — Encounter: Payer: Self-pay | Admitting: Family Medicine

## 2020-07-21 ENCOUNTER — Ambulatory Visit (INDEPENDENT_AMBULATORY_CARE_PROVIDER_SITE_OTHER): Payer: Managed Care, Other (non HMO) | Admitting: Family Medicine

## 2020-07-21 VITALS — BP 150/98 | HR 97 | Temp 97.6°F | Ht 73.0 in | Wt 299.8 lb

## 2020-07-21 DIAGNOSIS — Z0000 Encounter for general adult medical examination without abnormal findings: Secondary | ICD-10-CM | POA: Diagnosis not present

## 2020-07-21 MED ORDER — LISINOPRIL-HYDROCHLOROTHIAZIDE 10-12.5 MG PO TABS
1.0000 | ORAL_TABLET | Freq: Every day | ORAL | 3 refills | Status: DC
Start: 2020-07-21 — End: 2021-08-10

## 2020-07-21 NOTE — Progress Notes (Signed)
Subjective:    Patient ID: Vincent Grant, male    DOB: Apr 07, 1971, 49 y.o.   MRN: [id number]  HPI Here for a well exam. He feels fine. He has not had a kidney stone in a long time now. He sees Dr. Noberto Retort regularly for urologic exams. His BP has been up a bit, often around 140/90. He has put on about 30 lbs of weight in the past 2 years. Part of this is from quitting smoking however, and I congratulated him on this achievement.   Review of Systems  Constitutional: Negative.   HENT: Negative.   Eyes: Negative.   Respiratory: Negative.   Cardiovascular: Negative.   Gastrointestinal: Negative.   Genitourinary: Negative.   Musculoskeletal: Negative.   Skin: Negative.   Neurological: Negative.   Psychiatric/Behavioral: Negative.        Objective:   Physical Exam Constitutional:      General: He is not in acute distress.    Appearance: He is well-developed. He is obese. He is not diaphoretic.  HENT:     Head: Normocephalic and atraumatic.     Right Ear: External ear normal.     Left Ear: External ear normal.     Nose: Nose normal.     Mouth/Throat:     Pharynx: No oropharyngeal exudate.  Eyes:     General: No scleral icterus.       Right eye: No discharge.        Left eye: No discharge.     Conjunctiva/sclera: Conjunctivae normal.     Pupils: Pupils are equal, round, and reactive to light.  Neck:     Thyroid: No thyromegaly.     Vascular: No JVD.     Trachea: No tracheal deviation.  Cardiovascular:     Rate and Rhythm: Normal rate and regular rhythm.     Heart sounds: Normal heart sounds. No murmur heard.  No friction rub. No gallop.   Pulmonary:     Effort: Pulmonary effort is normal. No respiratory distress.     Breath sounds: Normal breath sounds. No wheezing or rales.  Chest:     Chest wall: No tenderness.  Abdominal:     General: Bowel sounds are normal. There is no distension.     Palpations: Abdomen is soft. There is no mass.     Tenderness: There is no  abdominal tenderness. There is no guarding or rebound.  Genitourinary:    Penis: No tenderness.   Musculoskeletal:        General: No tenderness. Normal range of motion.     Cervical back: Neck supple.  Lymphadenopathy:     Cervical: No cervical adenopathy.  Skin:    General: Skin is warm and dry.     Coloration: Skin is not pale.     Findings: No erythema or rash.  Neurological:     Mental Status: He is alert and oriented to person, place, and time.     Cranial Nerves: No cranial nerve deficit.     Motor: No abnormal muscle tone.     Coordination: Coordination normal.     Deep Tendon Reflexes: Reflexes are normal and symmetric. Reflexes normal.  Psychiatric:        Behavior: Behavior normal.        Thought Content: Thought content normal.        Judgment: Judgment normal.           Assessment & Plan:  Well exam. We discussed diet and exercise.  Get fasting labs. For the BP we will change to Lisinopril HCT 10-12.5 daily. Set up his first colonoscopy.  Alysia Penna, MD

## 2020-07-22 LAB — CBC WITH DIFFERENTIAL/PLATELET
Absolute Monocytes: 391 cells/uL (ref 200–950)
Basophils Absolute: 32 cells/uL (ref 0–200)
Basophils Relative: 0.7 %
Eosinophils Absolute: 83 cells/uL (ref 15–500)
Eosinophils Relative: 1.8 %
HCT: 46.6 % (ref 38.5–50.0)
Hemoglobin: 16 g/dL (ref 13.2–17.1)
Lymphs Abs: 1720 cells/uL (ref 850–3900)
MCH: 29.9 pg (ref 27.0–33.0)
MCHC: 34.3 g/dL (ref 32.0–36.0)
MCV: 86.9 fL (ref 80.0–100.0)
MPV: 11.3 fL (ref 7.5–12.5)
Monocytes Relative: 8.5 %
Neutro Abs: 2374 cells/uL (ref 1500–7800)
Neutrophils Relative %: 51.6 %
Platelets: 161 10*3/uL (ref 140–400)
RBC: 5.36 10*6/uL (ref 4.20–5.80)
RDW: 13.7 % (ref 11.0–15.0)
Total Lymphocyte: 37.4 %
WBC: 4.6 10*3/uL (ref 3.8–10.8)

## 2020-07-22 LAB — LIPID PANEL
Cholesterol: 186 mg/dL (ref <200)
HDL: 31 mg/dL — ABNORMAL LOW (ref >=40)
LDL Cholesterol (Calc): 121 mg/dL (calc) — ABNORMAL HIGH
Non-HDL Cholesterol (Calc): 155 mg/dL (calc) — ABNORMAL HIGH (ref <130)
Total CHOL/HDL Ratio: 6 (calc) — ABNORMAL HIGH (ref <5.0)
Triglycerides: 218 mg/dL — ABNORMAL HIGH (ref <150)

## 2020-07-22 LAB — BASIC METABOLIC PANEL
BUN: 19 mg/dL (ref 7–25)
CO2: 28 mmol/L (ref 20–32)
Calcium: 9.3 mg/dL (ref 8.6–10.3)
Chloride: 106 mmol/L (ref 98–110)
Creat: 0.95 mg/dL (ref 0.70–1.33)
Glucose, Bld: 116 mg/dL — ABNORMAL HIGH (ref 65–99)
Potassium: 4.2 mmol/L (ref 3.5–5.3)
Sodium: 142 mmol/L (ref 135–146)

## 2020-07-22 LAB — HEPATIC FUNCTION PANEL
AG Ratio: 1.5 (calc) (ref 1.0–2.5)
ALT: 43 U/L (ref 9–46)
AST: 37 U/L — ABNORMAL HIGH (ref 10–35)
Albumin: 4.5 g/dL (ref 3.6–5.1)
Alkaline phosphatase (APISO): 86 U/L (ref 35–144)
Bilirubin, Direct: 0.1 mg/dL (ref 0.0–0.2)
Globulin: 3 g/dL (calc) (ref 1.9–3.7)
Indirect Bilirubin: 0.4 mg/dL (calc) (ref 0.2–1.2)
Total Bilirubin: 0.5 mg/dL (ref 0.2–1.2)
Total Protein: 7.5 g/dL (ref 6.1–8.1)

## 2020-07-22 LAB — PSA: PSA: 0.1 ng/mL (ref <=4.0)

## 2020-07-22 LAB — TSH: TSH: 0.45 mIU/L (ref 0.40–4.50)

## 2020-07-24 ENCOUNTER — Encounter: Payer: Self-pay | Admitting: Gastroenterology

## 2020-07-25 ENCOUNTER — Telehealth: Payer: Self-pay

## 2020-07-25 NOTE — Telephone Encounter (Signed)
Physical form has been completed and placed up front for pick up. spoke with the patient and he is aware.

## 2020-08-07 ENCOUNTER — Ambulatory Visit (AMBULATORY_SURGERY_CENTER): Payer: Self-pay | Admitting: *Deleted

## 2020-08-07 ENCOUNTER — Encounter: Payer: Self-pay | Admitting: Gastroenterology

## 2020-08-07 ENCOUNTER — Other Ambulatory Visit: Payer: Self-pay

## 2020-08-07 VITALS — Ht 73.0 in | Wt 300.0 lb

## 2020-08-07 DIAGNOSIS — Z1211 Encounter for screening for malignant neoplasm of colon: Secondary | ICD-10-CM

## 2020-08-07 MED ORDER — SUTAB 1479-225-188 MG PO TABS: 1.0000 | ORAL_TABLET | Freq: Once | ORAL | 0 refills | Status: AC

## 2020-08-07 NOTE — Progress Notes (Signed)

## 2020-09-24 ENCOUNTER — Encounter: Payer: Self-pay | Admitting: Gastroenterology

## 2020-09-24 ENCOUNTER — Ambulatory Visit (AMBULATORY_SURGERY_CENTER): Payer: Managed Care, Other (non HMO) | Admitting: Gastroenterology

## 2020-09-24 ENCOUNTER — Encounter: Payer: Managed Care, Other (non HMO) | Admitting: Gastroenterology

## 2020-09-24 ENCOUNTER — Other Ambulatory Visit: Payer: Self-pay

## 2020-09-24 VITALS — BP 132/79 | HR 68 | Temp 96.9°F | Resp 16 | Ht 73.0 in | Wt 300.0 lb

## 2020-09-24 DIAGNOSIS — D123 Benign neoplasm of transverse colon: Secondary | ICD-10-CM

## 2020-09-24 DIAGNOSIS — D129 Benign neoplasm of anus and anal canal: Secondary | ICD-10-CM

## 2020-09-24 DIAGNOSIS — D128 Benign neoplasm of rectum: Secondary | ICD-10-CM

## 2020-09-24 DIAGNOSIS — Z1211 Encounter for screening for malignant neoplasm of colon: Secondary | ICD-10-CM

## 2020-09-24 DIAGNOSIS — D125 Benign neoplasm of sigmoid colon: Secondary | ICD-10-CM | POA: Diagnosis not present

## 2020-09-24 HISTORY — PX: COLONOSCOPY: SHX174

## 2020-09-24 MED ORDER — SODIUM CHLORIDE 0.9 % IV SOLN: 500.0000 mL | Freq: Once | INTRAVENOUS | Status: DC

## 2020-09-24 NOTE — Progress Notes (Signed)
Pt's states no medical or surgical changes since previsit or office visit. 

## 2020-09-24 NOTE — Progress Notes (Signed)
VS taken by JD 

## 2020-09-24 NOTE — Progress Notes (Signed)
Called to room to assist during endoscopic procedure.  Patient ID and intended procedure confirmed with present staff. Received instructions for my participation in the procedure from the performing physician.  

## 2020-09-24 NOTE — Patient Instructions (Signed)
Thank you for allowing [place] to care for you today!  Await results of polyps removed, approximately 1-2 weeks.  Will make recommendations at that time for next colonoscopy.  Resume previous diet and medications today.  Return to your normal activities tomorrow.  Recommend high fiber diet, drinking at least 64 ounces of water daily, and adding a stool bulking agent.  Example would be psyllium ( Metameucil) or similar product.    YOU HAD AN ENDOSCOPIC PROCEDURE TODAY AT [facility]:   Refer to the procedure report that was given to you for any specific questions about what was found during the examination.  If the procedure report does not answer your questions, please call your gastroenterologist to clarify.  If you requested that your care partner not be given the details of your procedure findings, then the procedure report has been included in a sealed envelope for you to review at your convenience later.  YOU SHOULD EXPECT: Some feelings of bloating in the abdomen. Passage of more gas than usual.  Walking can help get rid of the air that was put into your GI tract during the procedure and reduce the bloating. If you had a lower endoscopy (such as a colonoscopy or flexible sigmoidoscopy) you may notice spotting of blood in your stool or on the toilet paper. If you underwent a bowel prep for your procedure, you may not have a normal bowel movement for a few days.  Please Note:  You might notice some irritation and congestion in your nose or some drainage.  This is from the oxygen used during your procedure.  There is no need for concern and it should clear up in a day or so.  SYMPTOMS TO REPORT IMMEDIATELY:   Following lower endoscopy (colonoscopy or flexible sigmoidoscopy):  Excessive amounts of blood in the stool  Significant tenderness or worsening of abdominal pains  Swelling of the abdomen that is new, acute  Fever of 100F or higher     For urgent or emergent issues,  a gastroenterologist can be reached at any hour by calling [phone]. Do not use MyChart messaging for urgent concerns.    DIET:  We do recommend a small meal at first, but then you may proceed to your regular diet.  Drink plenty of fluids but you should avoid alcoholic beverages for 24 hours.  ACTIVITY:  You should plan to take it easy for the rest of today and you should NOT DRIVE or use heavy machinery until tomorrow (because of the sedation medicines used during the test).    FOLLOW UP: Our staff will call the number listed on your records 48-72 hours following your procedure to check on you and address any questions or concerns that you may have regarding the information given to you following your procedure. If we do not reach you, we will leave a message.  We will attempt to reach you two times.  During this call, we will ask if you have developed any symptoms of COVID 19. If you develop any symptoms (ie: fever, flu-like symptoms, shortness of breath, cough etc.) before then, please call [phone].  If you test positive for Covid 19 in the 2 weeks post procedure, please call and report this information to [place].    If any biopsies were taken you will be contacted by phone or by letter within the next 1-3 weeks.  Please call us at [phone] if you have not heard about the biopsies in 3 weeks.  SIGNATURES/CONFIDENTIALITY: You and/or your care partner have signed paperwork which will be entered into your electronic medical record.  These signatures attest to the fact that that the information above on your After Visit Summary has been reviewed and is understood.  Full responsibility of the confidentiality of this discharge information lies with you and/or your care-partner.

## 2020-09-24 NOTE — Progress Notes (Signed)
To PACU, vss. Report to [email or web address] 

## 2020-09-24 NOTE — Op Note (Addendum)
[place] Patient Name: Vincent Grant Procedure Date: 09/24/2020 9:08 AM MRN: [id number] Endoscopist: Thornton Park MD, MD Age: 49 Referring MD:  Date of Birth: [birth year] Gender: Male Account #: [id number] Procedure:                Colonoscopy Indications:              Screening for colorectal malignant neoplasm, This                            is the patient's first colonoscopy                           No known family history of colon cancer or polyps                           No baseline GI symptoms Medicines:                Monitored Anesthesia Care Procedure:                Pre-Anesthesia Assessment:                           - Prior to the procedure, a History and Physical                            was performed, and patient medications and                            allergies were reviewed. The patient's tolerance of                            previous anesthesia was also reviewed. The risks                            and benefits of the procedure and the sedation                            options and risks were discussed with the patient.                            All questions were answered, and informed consent                            was obtained. Prior Anticoagulants: The patient has                            taken no previous anticoagulant or antiplatelet                            agents. ASA Grade Assessment: II - A patient with                            mild systemic disease. After reviewing the risks  and benefits, the patient was deemed in                            satisfactory condition to undergo the procedure.                           After obtaining informed consent, the colonoscope                            was passed under direct vision. Throughout the                            procedure, the patient's blood pressure, pulse, and                            oxygen saturations were monitored continuously. The                             Colonoscope was introduced through the anus and                            advanced to the 3 cm into the ileum. The                            colonoscopy was performed with difficulty due to a                            tortuous colon. The patient tolerated the procedure                            well. The quality of the bowel preparation was                            good. The terminal ileum, ileocecal valve,                            appendiceal orifice, and rectum were photographed. Scope In: 9:18:41 AM Scope Out: 9:39:50 AM Scope Withdrawal Time: 0 hours 18 minutes 22 seconds  Total Procedure Duration: 0 hours 21 minutes 9 seconds  Findings:                 The perianal and digital rectal examinations were                            normal.                           Four sessile polyps were found in the rectum,                            sigmoid colon, transverse colon and hepatic                            flexure. The polyps were 1 to 3 mm in size. These  polyps were removed with a cold snare except for                            the smallest, 34mm polyp. The cold snare repeatedly                            slipped over the polyp despite multiple maneuvers.                            It was ultimately removed with cold forceps.                            Resection and retrieval were complete. Estimated                            blood loss was minimal.                           Multiple small and large-mouthed diverticula were                            found in the sigmoid colon, descending colon and                            ascending colon.                           The exam was otherwise without abnormality on                            direct and retroflexion views. Complications:            No immediate complications. Estimated blood loss:                            Minimal. Estimated Blood Loss:     Estimated blood  loss was minimal. Impression:               - Four 1 to 3 mm polyps in the rectum, in the                            sigmoid colon, in the transverse colon and at the                            hepatic flexure, removed with a cold snare and cold                            forceps. Resected and retrieved.                           - Diverticulosis in the sigmoid colon, in the                            descending colon and in the ascending colon.                           -  The examination was otherwise normal on direct                            and retroflexion views. Recommendation:           - Patient has a contact number available for                            emergencies. The signs and symptoms of potential                            delayed complications were discussed with the                            patient. Return to normal activities tomorrow.                            Written discharge instructions were provided to the                            patient.                           - Follow a high fiber diet. Drink at least 64                            ounces of water daily. Add a daily stool bulking                            agent such as psyllium (an exampled would be                            Metamucil).                           - Continue present medications.                           - Await pathology results.                           - Repeat colonoscopy date to be determined after                            pending pathology results are reviewed for                            surveillance.                           - Emerging evidence supports eating a diet of                            fruits, vegetables, grains, calcium, and yogurt  while reducing red meat and alcohol may reduce the                            risk of colon cancer.                           - Thank you for allowing me to be involved in your                             colon cancer prevention. Thornton Park MD, MD 09/24/2020 9:44:36 AM This report has been signed electronically.

## 2020-09-26 ENCOUNTER — Telehealth: Payer: Self-pay | Admitting: *Deleted

## 2020-09-26 NOTE — Telephone Encounter (Signed)
  Follow up Call-  Call back number 09/24/2020  Post procedure Call Back phone  # [phone]  Permission to leave phone message Yes  Some recent data might be hidden     Patient questions:  Do you have a fever, pain , or abdominal swelling? No. Pain Score  0 *  Have you tolerated food without any problems? yes  Have you been able to return to your normal activities? yes  Do you have any questions about your discharge instructions: Diet   No. Medications  No. Follow up visit  No.  Do you have questions or concerns about your Care? No.  Actions: * If pain score is 4 or above: 1. No action needed, pain <4.Have you developed a fever since your procedure? no  2.   Have you had an respiratory symptoms (SOB or cough) since your procedure? no  3.   Have you tested positive for COVID 19 since your procedure no  4.   Have you had any family members/close contacts diagnosed with the COVID 19 since your procedure?  no   If yes to any of these questions please route to Joylene John, RN and Joella Prince, RN

## 2020-09-30 ENCOUNTER — Encounter: Payer: Self-pay | Admitting: Gastroenterology

## 2021-08-09 ENCOUNTER — Other Ambulatory Visit: Payer: Self-pay | Admitting: Family Medicine

## 2021-08-10 NOTE — Telephone Encounter (Signed)
Pt needs appointment for further refills 

## 2021-08-27 ENCOUNTER — Other Ambulatory Visit: Payer: Self-pay

## 2021-08-27 ENCOUNTER — Encounter: Payer: Self-pay | Admitting: Family Medicine

## 2021-08-27 ENCOUNTER — Ambulatory Visit (INDEPENDENT_AMBULATORY_CARE_PROVIDER_SITE_OTHER): Payer: Managed Care, Other (non HMO) | Admitting: Family Medicine

## 2021-08-27 VITALS — BP 124/80 | HR 72 | Temp 98.5°F | Ht 73.0 in | Wt 270.0 lb

## 2021-08-27 DIAGNOSIS — G4730 Sleep apnea, unspecified: Secondary | ICD-10-CM | POA: Diagnosis not present

## 2021-08-27 DIAGNOSIS — Z23 Encounter for immunization: Secondary | ICD-10-CM

## 2021-08-27 DIAGNOSIS — Z0000 Encounter for general adult medical examination without abnormal findings: Secondary | ICD-10-CM

## 2021-08-27 LAB — HEPATIC FUNCTION PANEL
ALT: 25 U/L (ref 0–53)
AST: 24 U/L (ref 0–37)
Albumin: 4.6 g/dL (ref 3.5–5.2)
Alkaline Phosphatase: 73 U/L (ref 39–117)
Bilirubin, Direct: 0.2 mg/dL (ref 0.0–0.3)
Total Bilirubin: 0.8 mg/dL (ref 0.2–1.2)
Total Protein: 7.6 g/dL (ref 6.0–8.3)

## 2021-08-27 LAB — CBC WITH DIFFERENTIAL/PLATELET
Basophils Absolute: 0 10*3/uL (ref 0.0–0.1)
Basophils Relative: 0.6 % (ref 0.0–3.0)
Eosinophils Absolute: 0.1 10*3/uL (ref 0.0–0.7)
Eosinophils Relative: 1.6 % (ref 0.0–5.0)
HCT: 47.8 % (ref 39.0–52.0)
Hemoglobin: 16.3 g/dL (ref 13.0–17.0)
Lymphocytes Relative: 33.3 % (ref 12.0–46.0)
Lymphs Abs: 2.2 10*3/uL (ref 0.7–4.0)
MCHC: 34.2 g/dL (ref 30.0–36.0)
MCV: 87.1 fl (ref 78.0–100.0)
Monocytes Absolute: 0.5 10*3/uL (ref 0.1–1.0)
Monocytes Relative: 7.3 % (ref 3.0–12.0)
Neutro Abs: 3.8 10*3/uL (ref 1.4–7.7)
Neutrophils Relative %: 57.2 % (ref 43.0–77.0)
Platelets: 207 10*3/uL (ref 150.0–400.0)
RBC: 5.48 Mil/uL (ref 4.22–5.81)
RDW: 13.8 % (ref 11.5–15.5)
WBC: 6.6 10*3/uL (ref 4.0–10.5)

## 2021-08-27 LAB — BASIC METABOLIC PANEL
BUN: 14 mg/dL (ref 6–23)
CO2: 29 mEq/L (ref 19–32)
Calcium: 9.8 mg/dL (ref 8.4–10.5)
Chloride: 100 mEq/L (ref 96–112)
Creatinine, Ser: 1.05 mg/dL (ref 0.40–1.50)
GFR: 81.86 mL/min (ref >60.00)
Glucose, Bld: 93 mg/dL (ref 70–99)
Potassium: 4.1 mEq/L (ref 3.5–5.1)
Sodium: 138 mEq/L (ref 135–145)

## 2021-08-27 LAB — LIPID PANEL
Cholesterol: 195 mg/dL (ref 0–200)
HDL: 29.3 mg/dL — ABNORMAL LOW (ref >39.00)
NonHDL: 165.23
Total CHOL/HDL Ratio: 7
Triglycerides: 258 mg/dL — ABNORMAL HIGH (ref 0.0–149.0)
VLDL: 51.6 mg/dL — ABNORMAL HIGH (ref 0.0–40.0)

## 2021-08-27 LAB — PSA: PSA: 0.34 ng/mL (ref 0.10–4.00)

## 2021-08-27 LAB — TSH: TSH: 0.44 u[IU]/mL (ref 0.35–5.50)

## 2021-08-27 LAB — URIC ACID: Uric Acid, Serum: 6.6 mg/dL (ref 4.0–7.8)

## 2021-08-27 LAB — LDL CHOLESTEROL, DIRECT: Direct LDL: 134 mg/dL

## 2021-08-27 LAB — HEMOGLOBIN A1C: Hgb A1c MFr Bld: 5.8 % (ref 4.6–6.5)

## 2021-08-27 LAB — TESTOSTERONE: Testosterone: 412.4 ng/dL (ref 300.00–890.00)

## 2021-08-27 MED ORDER — LISINOPRIL-HYDROCHLOROTHIAZIDE 10-12.5 MG PO TABS
1.0000 | ORAL_TABLET | Freq: Every day | ORAL | 3 refills | Status: DC
Start: 2021-08-27 — End: 2022-09-03

## 2021-08-27 NOTE — Progress Notes (Signed)
Subjective:    Patient ID: Vincent Grant, male    DOB: [birth year], 50 y.o.   MRN: [id number]  HPI Here for a well exam. He has a few issues to discuss. First he says he has felt more tired lately than he used to, and he often feels sleepy during the day (which is not typical for him). He notes that he and his wife sleep in separate bedrooms because he snores so loudly. His brother is treated for sleep apnea. Also he has had some trouble with maintaining erections lately. He achieves an erection easily and his libido is normal.    Review of Systems  Constitutional:  Positive for fatigue.  HENT: Negative.    Eyes: Negative.   Respiratory: Negative.    Cardiovascular: Negative.   Gastrointestinal: Negative.   Genitourinary: Negative.   Musculoskeletal: Negative.   Skin: Negative.   Neurological: Negative.   Psychiatric/Behavioral: Negative.        Objective:   Physical Exam Constitutional:      General: He is not in acute distress.    Appearance: Normal appearance. He is well-developed. He is not diaphoretic.  HENT:     Head: Normocephalic and atraumatic.     Right Ear: External ear normal.     Left Ear: External ear normal.     Nose: Nose normal.     Mouth/Throat:     Pharynx: No oropharyngeal exudate.  Eyes:     General: No scleral icterus.       Right eye: No discharge.        Left eye: No discharge.     Conjunctiva/sclera: Conjunctivae normal.     Pupils: Pupils are equal, round, and reactive to light.  Neck:     Thyroid: No thyromegaly.     Vascular: No JVD.     Trachea: No tracheal deviation.  Cardiovascular:     Rate and Rhythm: Normal rate and regular rhythm.     Heart sounds: Normal heart sounds. No murmur heard.   No friction rub. No gallop.  Pulmonary:     Effort: Pulmonary effort is normal. No respiratory distress.     Breath sounds: Normal breath sounds. No wheezing or rales.  Chest:     Chest wall: No tenderness.  Abdominal:     General: Bowel  sounds are normal. There is no distension.     Palpations: Abdomen is soft. There is no mass.     Tenderness: There is no abdominal tenderness. There is no guarding or rebound.  Genitourinary:    Penis: Normal. No tenderness.      Testes: Normal.     Prostate: Normal.     Rectum: Normal. Guaiac result negative.  Musculoskeletal:        General: No tenderness. Normal range of motion.     Cervical back: Neck supple.  Lymphadenopathy:     Cervical: No cervical adenopathy.  Skin:    General: Skin is warm and dry.     Coloration: Skin is not pale.     Findings: No erythema or rash.  Neurological:     Mental Status: He is alert and oriented to person, place, and time.     Cranial Nerves: No cranial nerve deficit.     Motor: No abnormal muscle tone.     Coordination: Coordination normal.     Deep Tendon Reflexes: Reflexes are normal and symmetric. Reflexes normal.  Psychiatric:        Behavior: Behavior normal.  Thought Content: Thought content normal.        Judgment: Judgment normal.          Assessment & Plan:  Well exam. We discussed diet ane exercise. Get fasting labs. I think it likely that he has sleep apnea so we will refer him for a sleep study. We will also check a testosterone level. Alysia Penna, MD

## 2021-10-06 ENCOUNTER — Ambulatory Visit (INDEPENDENT_AMBULATORY_CARE_PROVIDER_SITE_OTHER): Payer: Managed Care, Other (non HMO) | Admitting: Pulmonary Disease

## 2021-10-06 ENCOUNTER — Ambulatory Visit (INDEPENDENT_AMBULATORY_CARE_PROVIDER_SITE_OTHER): Payer: Managed Care, Other (non HMO)

## 2021-10-06 ENCOUNTER — Encounter: Payer: Self-pay | Admitting: Pulmonary Disease

## 2021-10-06 ENCOUNTER — Other Ambulatory Visit: Payer: Self-pay

## 2021-10-06 VITALS — BP 120/82 | HR 80 | Temp 98.2°F | Ht 73.5 in | Wt 269.0 lb

## 2021-10-06 DIAGNOSIS — R0609 Other forms of dyspnea: Secondary | ICD-10-CM

## 2021-10-06 DIAGNOSIS — R0683 Snoring: Secondary | ICD-10-CM | POA: Diagnosis not present

## 2021-10-06 NOTE — Patient Instructions (Signed)
Chest xray today Will schedule pulmonary function test Will arrange for home sleep study Will call to arrange for follow up after sleep study reviewed

## 2021-10-06 NOTE — Progress Notes (Signed)
Pulmonary, Critical Care, and Sleep Medicine  Chief Complaint  Patient presents with   Consult    Sleep consult    Past Surgical History:  He  has a past surgical history that includes Kidney stone surgery (Right, 11/29/2009); Tonsillectomy and adenoidectomy; and Colonoscopy (09/24/2020).  Past Medical History:  HTN, Nephrolithiasis  Constitutional:  BP 120/82 (BP Location: Left Arm, Cuff Size: Large)   Pulse 80   Temp 98.2 F (36.8 C) (Oral)   Ht 6' 1.5" (1.867 m)   Wt 269 lb (122 kg)   SpO2 99%   BMI 35.01 kg/m   Brief Summary:  Vincent Grant is a 50 y.o. male smoker with snoring and dyspnea.      Subjective:   His wife has been concerned about his snoring.  She says he stops breathing while asleep also.  He is a restless sleeper.  He falls asleep during in the evening while watching TV.  His brother has sleep apnea and uses CPAP.  He goes to sleep between 9 and 10 pm.  He falls asleep in 30 minutes.  He sleeps through the night.  He gets out of bed at 6 am.  He feels tired in the morning.  He denies morning headache.  He does not use anything to help him fall sleep or stay awake.  He denies sleep walking, sleep talking, bruxism, or nightmares.  There is no history of restless legs.  He denies sleep hallucinations, sleep paralysis, or cataplexy.  The Epworth score is 9 out of 24.  He smokes cigarettes.  He has been able to quit cold [place] a couple of times before, but started back again.  He hasn't been as active since the pandemic started.  He notices getting winded more quickly with certain activities (walking up stairs carrying laundry).  He recovers after resting for a few minutes.  He is not having cough, wheeze, sputum, or hemoptysis.  He denies chest pain, but does feel his heart rate when he is doing activity.  Lab work from 08/27/21: WBC 6.6, Hb 16.3, PLT 207, Na 138, K 4.1, CO2 29, Creatinine 1.05, Bili 0.8, ALP 73, AST 24, ALT 25, Protein  7.6.   Physical Exam:   Appearance - well kempt   ENMT - no sinus tenderness, no oral exudate, no LAN, Mallampati 2 airway, no stridor, decreased AP diameter, low laying soft palate  Respiratory - equal breath sounds bilaterally, no wheezing or rales  CV - s1s2 regular rate and rhythm, no murmurs  Ext - no clubbing, no edema  Skin - no rashes  Psych - normal mood and affect   Pulmonary testing:    Chest Imaging:    Sleep Tests:    Cardiac Tests:    Social History:  He  reports that he has been smoking cigarettes. He has a 30.00 pack-year smoking history. He has never used smokeless tobacco. He reports current alcohol use of about 2.0 standard drinks per week. He reports that he does not use drugs.  Family History:  His family history includes Cancer in his son; Colon polyps in his father.    Discussion:  He has snoring, sleep disruption, apnea and daytime sleepiness.  He has history of hypertension.  His BMI is > 35.  I am concerned he could have obstructive sleep apnea.  He has history of smoking.  He reports dyspnea on exertion.  Assessment/Plan:   Snoring with excessive daytime sleepiness. - will need to arrange for  a home sleep study  Dyspnea on exertion. - will arrange for chest xray and pulmonary function test to assess for obstructive lung disease - if pulmonary assessment unrevealing, then might need further cardiac assessment - certainly deconditioning could be contributing to his symptoms and he would benefit from a regular exercise regimen assuming the remainder of his evaluation is unrevealing  Tobacco abuse. - reviewed options to help quit smoking - he will try to gradually quit  Obesity. - discussed how weight can impact sleep and risk for sleep disordered breathing - discussed options to assist with weight loss: combination of diet modification, cardiovascular and strength training exercises  Cardiovascular risk. - had an extensive  discussion regarding the adverse health consequences related to untreated sleep disordered breathing - specifically discussed the risks for hypertension, coronary artery disease, cardiac dysrhythmias, cerebrovascular disease, and diabetes - lifestyle modification discussed  Safe driving practices. - discussed how sleep disruption can increase risk of accidents, particularly when driving - safe driving practices were discussed  Therapies for obstructive sleep apnea. - if the sleep study shows significant sleep apnea, then various therapies for treatment were reviewed: CPAP, oral appliance, and surgical interventions   Time Spent Involved in Patient Care on Day of Examination:  46 minutes  Follow up:   Patient Instructions  Chest xray today Will schedule pulmonary function test Will arrange for home sleep study Will call to arrange for follow up after sleep study reviewed  Medication List:   Allergies as of 10/06/2021   No Known Allergies      Medication List        Accurate as of October 06, 2021 10:09 AM. If you have any questions, ask your nurse or doctor.          allopurinol 100 MG tablet Commonly known as: ZYLOPRIM   lisinopril-hydrochlorothiazide 10-12.5 MG tablet Commonly known as: ZESTORETIC Take 1 tablet by mouth daily.   Potassium Citrate 15 MEQ (1620 MG) Tbcr        Signature:  Chesley Mires, MD [place] Pager - [phone] 10/06/2021, 10:09 AM

## 2021-11-17 ENCOUNTER — Ambulatory Visit (INDEPENDENT_AMBULATORY_CARE_PROVIDER_SITE_OTHER): Payer: Managed Care, Other (non HMO) | Admitting: Pulmonary Disease

## 2021-11-17 ENCOUNTER — Other Ambulatory Visit: Payer: Self-pay

## 2021-11-17 DIAGNOSIS — R0609 Other forms of dyspnea: Secondary | ICD-10-CM

## 2021-11-17 LAB — PULMONARY FUNCTION TEST
DL/VA % pred: 119 %
DL/VA: 5.16 ml/min/mmHg/L
DLCO cor % pred: 93 %
DLCO cor: 30.28 ml/min/mmHg
DLCO unc % pred: 93 %
DLCO unc: 30.28 ml/min/mmHg
FEF 25-75 Post: 5.79 L/sec
FEF 25-75 Pre: 5.41 L/sec
FEF2575-%Change-Post: 6 %
FEF2575-%Pred-Post: 156 %
FEF2575-%Pred-Pre: 145 %
FEV1-%Change-Post: 2 %
FEV1-%Pred-Post: 88 %
FEV1-%Pred-Pre: 86 %
FEV1-Post: 3.85 L
FEV1-Pre: 3.76 L
FEV1FVC-%Change-Post: 1 %
FEV1FVC-%Pred-Pre: 114 %
FEV6-%Change-Post: 0 %
FEV6-%Pred-Post: 78 %
FEV6-%Pred-Pre: 78 %
FEV6-Post: 4.26 L
FEV6-Pre: 4.24 L
FEV6FVC-%Pred-Post: 103 %
FEV6FVC-%Pred-Pre: 103 %
FVC-%Change-Post: 0 %
FVC-%Pred-Post: 75 %
FVC-%Pred-Pre: 75 %
FVC-Post: 4.26 L
FVC-Pre: 4.24 L
Post FEV1/FVC ratio: 90 %
Post FEV6/FVC ratio: 100 %
Pre FEV1/FVC ratio: 89 %
Pre FEV6/FVC Ratio: 100 %
RV % pred: 97 %
RV: 2.2 L
TLC % pred: 84 %
TLC: 6.45 L

## 2021-11-17 NOTE — Progress Notes (Signed)
PFT done today. 

## 2021-12-17 ENCOUNTER — Telehealth (INDEPENDENT_AMBULATORY_CARE_PROVIDER_SITE_OTHER): Payer: Managed Care, Other (non HMO) | Admitting: Family Medicine

## 2021-12-17 ENCOUNTER — Encounter: Payer: Self-pay | Admitting: Family Medicine

## 2021-12-17 DIAGNOSIS — H6983 Other specified disorders of Eustachian tube, bilateral: Secondary | ICD-10-CM

## 2021-12-17 DIAGNOSIS — R110 Nausea: Secondary | ICD-10-CM

## 2021-12-17 DIAGNOSIS — J069 Acute upper respiratory infection, unspecified: Secondary | ICD-10-CM

## 2021-12-17 MED ORDER — ONDANSETRON HCL 4 MG PO TABS: 4.0000 mg | ORAL_TABLET | Freq: Three times a day (TID) | ORAL | 0 refills | Status: DC | PRN

## 2021-12-17 MED ORDER — FLUTICASONE PROPIONATE 50 MCG/ACT NA SUSP: 1.0000 | Freq: Every day | NASAL | 0 refills | Status: DC

## 2021-12-17 NOTE — Progress Notes (Signed)
Virtual Visit via Video Note  I connected with Vincent Grant on 12/17/21 at  1:30 PM EST by a video enabled telemedicine application 2/2 [id number] pandemic and verified that I am speaking with the correct person using two identifiers.  Location patient: home Location provider:work or home office Persons participating in the virtual visit: patient, provider  I discussed the limitations of evaluation and management by telemedicine and the availability of in person appointments. The patient expressed understanding and agreed to proceed.  Chief Complaint  Patient presents with   Dizziness    Dizzy, nauseous, dehydrated, cold and achey all started yesterday morning. Stated he stayed in bed all day yesterday, extremely tired. Took COVID test, was neg. Took Nyquil last night.    HPI: Pt is a 51 year old male with pmh sig for HTN who is followed by Dr. Sarajane Jews and seen for acute concern. Pt with nausea, malaise/tired, chills, nausea that started yesterday 12/16/21. No emesis.  Dry heaves this am. Endorses chest tightness this am, pressure in ears/neck, dry cough, and dizziness.   Notes ears popped last wk. Numbness of skin x 2 days. Denies HA, fever, ST, sick contacts. Pt denies sick contacts but went to a hockey game in [place]. Took Nyquil last  Appetite fine.  ROS: See pertinent positives and negatives per HPI.  Past Medical History:  Diagnosis Date   Hypertension    Kidney stones     Past Surgical History:  Procedure Laterality Date   COLONOSCOPY  09/24/2020   per Dr. Tarri Glenn, tubular adenomas, repeat in 3 yrs   KIDNEY STONE SURGERY Right 11/29/2009   per Dr. Jeffie Pollock   TONSILLECTOMY AND ADENOIDECTOMY     adenoids as a child    Family History  Problem Relation Age of Onset   Cancer Son        Ewings sarcoma    Colon polyps Father    Colon cancer Neg Hx    Esophageal cancer Neg Hx    Rectal cancer Neg Hx    Stomach cancer Neg Hx      Current Outpatient Medications:     allopurinol (ZYLOPRIM) 100 MG tablet, , Disp: , Rfl:    lisinopril-hydrochlorothiazide (ZESTORETIC) 10-12.5 MG tablet, Take 1 tablet by mouth daily., Disp: 90 tablet, Rfl: 3   Potassium Citrate 15 MEQ (1620 MG) TBCR, , Disp: , Rfl:   EXAM:  VITALS per patient if applicable: RR between 84-16 bpm  GENERAL: alert, oriented, appears well and in no acute distress  HEENT: atraumatic, conjunctiva clear, no obvious abnormalities on inspection of external nose and ears  NECK: normal movements of the head and neck  LUNGS: on inspection no signs of respiratory distress, breathing rate appears normal, no obvious gross SOB, gasping or wheezing  CV: no obvious cyanosis  MS: moves all visible extremities without noticeable abnormality  PSYCH/NEURO: pleasant and cooperative, no obvious depression or anxiety, speech and thought processing grossly intact  ASSESSMENT AND PLAN:  Discussed the following assessment and plan:  Viral URI  -Negative at home COVID test -OTC cough/cold medication, antihistamine, rest, hydration, Tylenol as needed -Consider sports drink for rehydration -Given precautions - Plan: fluticasone (FLONASE) 50 MCG/ACT nasal spray  Dysfunction of both eustachian tubes  - Plan: fluticasone (FLONASE) 50 MCG/ACT nasal spray  Nausea  - Plan: ondansetron (ZOFRAN) 4 MG tablet  Follow-up as needed with PCP   I discussed the assessment and treatment plan with the patient. The patient was provided an opportunity to ask questions and all were  answered. The patient agreed with the plan and demonstrated an understanding of the instructions.   The patient was advised to call back or seek an in-person evaluation if the symptoms worsen or if the condition fails to improve as anticipated.  Billie Ruddy, MD

## 2022-02-03 ENCOUNTER — Ambulatory Visit: Payer: Managed Care, Other (non HMO)

## 2022-02-03 ENCOUNTER — Other Ambulatory Visit: Payer: Self-pay

## 2022-02-03 DIAGNOSIS — G4733 Obstructive sleep apnea (adult) (pediatric): Secondary | ICD-10-CM

## 2022-02-03 DIAGNOSIS — R0683 Snoring: Secondary | ICD-10-CM

## 2022-02-05 ENCOUNTER — Telehealth: Payer: Self-pay | Admitting: Pulmonary Disease

## 2022-02-05 DIAGNOSIS — G4733 Obstructive sleep apnea (adult) (pediatric): Secondary | ICD-10-CM | POA: Diagnosis not present

## 2022-02-05 NOTE — Telephone Encounter (Signed)
HST 02/03/22 >> AHI 33.7, SpO2 low 79% ? ? ?Please inform him that his sleep study shows severe obstructive sleep apnea.  Please arrange for ROV with me or NP to discuss treatment options. ? ? ? ?

## 2022-02-05 NOTE — Telephone Encounter (Signed)
ATC patient. LMTCB with RDS office number  

## 2022-02-08 NOTE — Telephone Encounter (Signed)
Pt returning call for results.  [phone] ?

## 2022-02-08 NOTE — Telephone Encounter (Signed)
Called and went over results with patient. He voiced understanding. Scheduled him for an appt with [facility] APP for 3/22 at 4:00. Nothing further needed. ?

## 2022-02-17 ENCOUNTER — Encounter: Payer: Self-pay | Admitting: Nurse Practitioner

## 2022-02-17 ENCOUNTER — Ambulatory Visit (INDEPENDENT_AMBULATORY_CARE_PROVIDER_SITE_OTHER): Payer: Managed Care, Other (non HMO) | Admitting: Nurse Practitioner

## 2022-02-17 ENCOUNTER — Other Ambulatory Visit: Payer: Self-pay

## 2022-02-17 VITALS — BP 110/76 | HR 85 | Temp 98.4°F | Ht 73.5 in | Wt 263.0 lb

## 2022-02-17 DIAGNOSIS — Z720 Tobacco use: Secondary | ICD-10-CM

## 2022-02-17 DIAGNOSIS — R0609 Other forms of dyspnea: Secondary | ICD-10-CM | POA: Diagnosis not present

## 2022-02-17 DIAGNOSIS — G4733 Obstructive sleep apnea (adult) (pediatric): Secondary | ICD-10-CM | POA: Insufficient documentation

## 2022-02-17 NOTE — Progress Notes (Signed)
@Patient  ID: Vincent Grant, male    DOB: 06-Jan-1971, 51 y.o.   MRN: [id number]  Chief Complaint  Patient presents with   Follow-up    He is here to go over HST results.     Referring provider: Nelwyn Salisbury, MD  HPI: 50 year old male, current everyday smoker (1 ppd) followed for DOE and severe OSA. He is a patient of Dr. Evlyn Courier and last seen in office on 10/06/2021. Past medical history significant for HTN and obesity.  TEST/EVENTS:  11/17/2021 PFTs: FVC 4.26 (75), FEV1 2.85 (88), ratio 90, TLC 84%, DLCOcor 93%. No BD. Normal spirometry, lung volumes and diffusion capacity  02/05/2022 HST: AHI 33.7, SpO2 low 79%  10/06/2021: OV with Dr. Craige Cotta for consult. Reported snoring and apneic events at night. Falls asleep frequently in the evenings watching TV. Heavy smoker. Notices getting winded more frequently with activities than he did pre-pandemic. Doesn't feel as though he is as active. HST ordered. CXR nl. PFTs nl.   02/17/2022: Today - follow up Patient presents today for follow up after home sleep study. His study showed severe obstructive sleep apnea with AHI 33.7. He reports that he feels well. Continues to have some mild DOE with activities. He also continues to smoke 1 ppd. No desire to quit at this time. He gets around 6-7 hours of sleep a night. Continues to fall asleep while watching TV in the evenings. Denies drowsy driving, morning headaches or narcolepsy.   No Known Allergies  Immunization History  Administered Date(s) Administered   Influenza, Quadrivalent, Recombinant, Inj, Pf 01/28/2018   Influenza,inj,Quad PF,6+ Mos 08/27/2021   Influenza-Unspecified 09/28/2015   PFIZER(Purple Top)SARS-COV-2 Vaccination 02/16/2020, 03/10/2020, 10/15/2020, 09/11/2021   Tdap 03/06/2018   Zoster Recombinat (Shingrix) 11/04/2020, 01/14/2021    Past Medical History:  Diagnosis Date   Hypertension    Kidney stones     Tobacco History: Social History   Tobacco Use  Smoking Status  Every Day   Packs/day: 1.00   Years: 30.00   Pack years: 30.00   Types: Cigarettes  Smokeless Tobacco Never  Tobacco Comments   1/2 pack each day   Ready to quit: Not Answered Counseling given: Not Answered Tobacco comments: 1/2 pack each day   Outpatient Medications Prior to Visit  Medication Sig Dispense Refill   allopurinol (ZYLOPRIM) 100 MG tablet      lisinopril-hydrochlorothiazide (ZESTORETIC) 10-12.5 MG tablet Take 1 tablet by mouth daily. 90 tablet 3   Potassium Citrate 15 MEQ (1620 MG) TBCR      fluticasone (FLONASE) 50 MCG/ACT nasal spray Place 1 spray into both nostrils daily. 16 g 0   ondansetron (ZOFRAN) 4 MG tablet Take 1 tablet (4 mg total) by mouth every 8 (eight) hours as needed for nausea or vomiting. 20 tablet 0   No facility-administered medications prior to visit.     Review of Systems:   Constitutional: No weight loss or gain, night sweats, fevers, chills. +daytime fatigue HEENT: No headaches, difficulty swallowing, tooth/dental problems, or sore throat. No sneezing, itching, ear ache, nasal congestion, or post nasal drip. +snoring CV:  No chest pain, orthopnea, PND, swelling in lower extremities, anasarca, dizziness, palpitations, syncope Resp: +minimal shortness of breath with exertion. No excess mucus or change in color of mucus. No productive or non-productive. No hemoptysis. No wheezing.  No chest wall deformity Skin: No rash, lesions, ulcerations MSK:  No joint pain or swelling.  No decreased range of motion.  No back pain. Neuro: No  dizziness or lightheadedness.  Psych: No depression or anxiety. Mood stable.     Physical Exam:  BP 110/76 (BP Location: Left Arm, Patient Position: Sitting, Cuff Size: Normal)   Pulse 85   Temp 98.4 F (36.9 C) (Oral)   Ht 6' 1.5" (1.867 m)   Wt 263 lb (119.3 kg)   SpO2 97%   BMI 34.23 kg/m   GEN: Pleasant, interactive, well-appearing; obese; in no acute distress. HEENT:  Normocephalic and atraumatic.  PERRLA. Sclera white. Nasal turbinates pink, moist and patent bilaterally. No rhinorrhea present. Oropharynx pink and moist, without exudate or edema. No lesions, ulcerations, or postnasal drip.  CV: RRR, no m/r/g, no peripheral edema. Pulses intact, +2 bilaterally. No cyanosis, pallor or clubbing. PULMONARY:  Unlabored, regular breathing. Clear bilaterally A&P w/o wheezes/rales/rhonchi. No accessory muscle use. No dullness to percussion. GI: BS present and normoactive. Soft, non-tender to palpation.  Neuro: A/Ox3. No focal deficits noted.   Skin: Warm, no lesions or rashes Psych: Normal affect and behavior. Judgement and thought content appropriate.     Lab Results:  CBC    Component Value Date/Time   WBC 6.6 08/27/2021 1044   RBC 5.48 08/27/2021 1044   HGB 16.3 08/27/2021 1044   HCT 47.8 08/27/2021 1044   PLT 207.0 08/27/2021 1044   MCV 87.1 08/27/2021 1044   MCH 29.9 07/21/2020 0901   MCHC 34.2 08/27/2021 1044   RDW 13.8 08/27/2021 1044   LYMPHSABS 2.2 08/27/2021 1044   MONOABS 0.5 08/27/2021 1044   EOSABS 0.1 08/27/2021 1044   BASOSABS 0.0 08/27/2021 1044    BMET    Component Value Date/Time   NA 138 08/27/2021 1044   K 4.1 08/27/2021 1044   CL 100 08/27/2021 1044   CO2 29 08/27/2021 1044   GLUCOSE 93 08/27/2021 1044   BUN 14 08/27/2021 1044   CREATININE 1.05 08/27/2021 1044   CREATININE 0.95 07/21/2020 0901   CALCIUM 9.8 08/27/2021 1044   GFRNONAA >60 06/15/2010 0750   GFRAA  06/15/2010 0750    >60        The eGFR has been calculated using the MDRD equation. This calculation has not been validated in all clinical situations. eGFR's persistently <60 mL/min signify possible Chronic Kidney Disease.    BNP No results found for: BNP   Imaging:  No results found.       Latest Ref Rng & Units 11/17/2021    8:47 AM  PFT Results  FVC-Pre L 4.24    FVC-Predicted Pre % 75    FVC-Post L 4.26    FVC-Predicted Post % 75    Pre FEV1/FVC % % 89    Post  FEV1/FCV % % 90    FEV1-Pre L 3.76    FEV1-Predicted Pre % 86    FEV1-Post L 3.85    DLCO uncorrected ml/min/mmHg 30.28    DLCO UNC% % 93    DLCO corrected ml/min/mmHg 30.28    DLCO COR %Predicted % 93    DLVA Predicted % 119    TLC L 6.45    TLC % Predicted % 84    RV % Predicted % 97      No results found for: NITRICOXIDE      Assessment & Plan:   Severe obstructive sleep apnea New diagnosis of severe OSA with AHI 33.7. Agreeable to starting on CPAP therapy. New order sent to DME for auto 10-20 cmH2O with nasal mask and heated humidification. We also briefly reviewed treatment options including weight  loss, side sleeping position, CPAP therapy and referral to ENT for possible surgical options if he fails CPAP  Patient Instructions  Start CPAP auto 10-20 cmH2O, minimum of 4-6 hours a night.  Change equipment every 30 days or as directed by DME. Wash your tubing with warm soap and water daily, hang to dry. Wash humidifier portion weekly.  Be aware of reduced alertness and do not drive or operate heavy machinery if experiencing this or drowsiness.  Exercise encouraged, as tolerated. Healthy weight management discussed.  Avoid or decrease alcohol consumption and medications that make you more sleepy, if possible. Notify if persistent daytime sleepiness occurs even with consistent use of CPAP.  We discussed how untreated sleep apnea puts an individual at risk for cardiac arrhthymias, pulm HTN, DM, stroke and increases their risk for daytime accidents.   Please let us know if you would like to move forward with lung cancer screening program.   Follow up in 6-8 weeks with Dr. Craige Cotta or Philis Nettle. If you have not been using your CPAP for at least 31 days, please call to reschedule within 90 days of starting therapy. If symptoms do not improve or worsen, please contact office for sooner follow up or seek emergency care.     Tobacco abuse Continues to smoke 1 ppd. Suspect this  contributes to his DOE; no desire to quit. Educated on lung cancer screening program - pt would like to think about it and will notify if he would like to be referred.   DOE (dyspnea on exertion) Recent normal PFTs. No significant cardiac symptoms or history. Untreated OSA likely contributing as well as deconditioning. If symptoms persist, can refer for further cardiac workup.    I spent 32 minutes of dedicated to the care of this patient on the date of this encounter to include pre-visit review of records, face-to-face time with the patient discussing conditions above, post visit ordering of testing, clinical documentation with the electronic health record, making appropriate referrals as documented, and communicating necessary findings to members of the patients care team.  Noemi Chapel, NP 02/17/2022  Pt aware and understands NP's role.

## 2022-02-17 NOTE — Progress Notes (Signed)
Reviewed and agree with assessment/plan. ? ? ?Chesley Mires, MD ?[place] ?02/17/2022, 4:37 PM ?Pager:  [phone] ? ?

## 2022-02-17 NOTE — Assessment & Plan Note (Addendum)
New diagnosis of severe OSA with AHI 33.7. Agreeable to starting on CPAP therapy. New order sent to DME for auto 10-20 cmH2O with nasal mask and heated humidification. We also briefly reviewed treatment options including weight loss, side sleeping position, CPAP therapy and referral to ENT for possible surgical options if he fails CPAP ? ?Patient Instructions  ?Start CPAP auto 10-20 cmH2O, minimum of 4-6 hours a night.  ?Change equipment every 30 days or as directed by DME. Wash your tubing with warm soap and water daily, hang to dry. Wash humidifier portion weekly.  ?Be aware of reduced alertness and do not drive or operate heavy machinery if experiencing this or drowsiness.  ?Exercise encouraged, as tolerated. ?Healthy weight management discussed.  ?Avoid or decrease alcohol consumption and medications that make you more sleepy, if possible. ?Notify if persistent daytime sleepiness occurs even with consistent use of CPAP. ? ?We discussed how untreated sleep apnea puts an individual at risk for cardiac arrhthymias, pulm HTN, DM, stroke and increases their risk for daytime accidents.  ? ?Please let us know if you would like to move forward with lung cancer screening program.  ? ?Follow up in 6-8 weeks with Dr. Halford Chessman or Alanson Aly. If you have not been using your CPAP for at least 31 days, please call to reschedule within 90 days of starting therapy. If symptoms do not improve or worsen, please contact office for sooner follow up or seek emergency care. ? ? ? ?

## 2022-02-17 NOTE — Patient Instructions (Signed)
Start CPAP auto 10-20 cmH2O, minimum of 4-6 hours a night.  ?Change equipment every 30 days or as directed by DME. Wash your tubing with warm soap and water daily, hang to dry. Wash humidifier portion weekly.  ?Be aware of reduced alertness and do not drive or operate heavy machinery if experiencing this or drowsiness.  ?Exercise encouraged, as tolerated. ?Healthy weight management discussed.  ?Avoid or decrease alcohol consumption and medications that make you more sleepy, if possible. ?Notify if persistent daytime sleepiness occurs even with consistent use of CPAP. ? ?We discussed how untreated sleep apnea puts an individual at risk for cardiac arrhthymias, pulm HTN, DM, stroke and increases their risk for daytime accidents.  ? ?Please let us know if you would like to move forward with lung cancer screening program.  ? ?Follow up in 6-8 weeks with Dr. Halford Chessman or Alanson Aly. If you have not been using your CPAP for at least 31 days, please call to reschedule within 90 days of starting therapy. If symptoms do not improve or worsen, please contact office for sooner follow up or seek emergency care. ?

## 2022-02-17 NOTE — Assessment & Plan Note (Signed)
Continues to smoke 1 ppd. Suspect this contributes to his DOE; no desire to quit. Educated on lung cancer screening program - pt would like to think about it and will notify if he would like to be referred.  ?

## 2022-02-17 NOTE — Assessment & Plan Note (Signed)
Recent normal PFTs. No significant cardiac symptoms or history. Untreated OSA likely contributing as well as deconditioning. If symptoms persist, can refer for further cardiac workup.  ?

## 2022-04-07 ENCOUNTER — Ambulatory Visit: Payer: Managed Care, Other (non HMO) | Admitting: Pulmonary Disease

## 2022-05-19 ENCOUNTER — Encounter: Payer: Self-pay | Admitting: Pulmonary Disease

## 2022-05-19 ENCOUNTER — Ambulatory Visit (INDEPENDENT_AMBULATORY_CARE_PROVIDER_SITE_OTHER): Payer: Managed Care, Other (non HMO) | Admitting: Pulmonary Disease

## 2022-05-19 VITALS — BP 120/80 | HR 84 | Temp 98.2°F | Ht 74.0 in | Wt 263.8 lb

## 2022-05-19 DIAGNOSIS — Z720 Tobacco use: Secondary | ICD-10-CM | POA: Diagnosis not present

## 2022-05-19 DIAGNOSIS — R0609 Other forms of dyspnea: Secondary | ICD-10-CM | POA: Diagnosis not present

## 2022-05-19 DIAGNOSIS — G4730 Sleep apnea, unspecified: Secondary | ICD-10-CM

## 2022-05-19 DIAGNOSIS — G4733 Obstructive sleep apnea (adult) (pediatric): Secondary | ICD-10-CM

## 2022-05-19 DIAGNOSIS — E669 Obesity, unspecified: Secondary | ICD-10-CM

## 2022-05-19 NOTE — Progress Notes (Signed)
[facility]  Chief Complaint  Patient presents with   Follow-up    Patient is doing good, still wakes up pretty early but feels like he is making it through the night. Patient states that when he puts the mask on he feels like he is not getting enough air.    Past Surgical History:  He  has a past surgical history that includes Kidney stone surgery (Right, 11/29/2009); Tonsillectomy and adenoidectomy; and Colonoscopy (09/24/2020).  Past Medical History:  HTN, Nephrolithiasis  Constitutional:  BP 120/80 (BP Location: Left Arm, Patient Position: Sitting, Cuff Size: Normal)   Pulse 84   Temp 98.2 F (36.8 C) (Oral)   Ht '6\' 2"'$  (1.88 m)   Wt 263 lb 12.8 oz (119.7 kg)   SpO2 98%   BMI 33.87 kg/m   Brief Summary:  Vincent Grant is a 51 y.o. male smoker with obstructive sleep apnea.      Subjective:   Home sleep study showed severe sleep apnea.  He has been using CPAP.  This has helped.  He started with nasal pillows and chin strap, but didn't like the chin strap.  Doing better with full face mask.  No having sinus congestion.  Mild dry mouth in the morning.  Breathing okay, but does get winded still sometimes.  Doesn't feel this is much of an issue at present.  PFT from December and chest xray from November were normal.   Physical Exam:   Appearance - well kempt   ENMT - no sinus tenderness, no oral exudate, no LAN, Mallampati 2 airway, no stridor, decreased AP diameter, low laying soft palate  Respiratory - equal breath sounds bilaterally, no wheezing or rales  CV - s1s2 regular rate and rhythm, no murmurs  Ext - no clubbing, no edema  Skin - no rashes  Psych - normal mood and affect   Pulmonary testing:  PFT 11/17/21 >> FEV1 3.85 (88%), FEV1% 90, TLC 6.45 (84%), DLCO 93%  Sleep Tests:  HST 02/03/22 >> AHI 33.7, SpO2 low 79%  Social History:  He  reports that he has been smoking cigarettes. He has a 30.00 pack-year  smoking history. He has never used smokeless tobacco. He reports current alcohol use of about 2.0 standard drinks of alcohol per week. He reports that he does not use drugs.  Family History:  His family history includes Cancer in his son; Colon polyps in his father.     Assessment/Plan:   Obstructive sleep apnea. - he is compliant with CPAP and reports benefit from therapy - he uses [employer] for his DME - order for current CPAP placed on 02/17/22 - continue auto CPAP 5 to 15 cm H2O  Dyspnea on exertion. - likely related to obesity and deconditioning - might need further cardiac assessment if symptoms progress  Tobacco abuse. - reviewed options to help quit smoking - he will try to gradually quit  Obesity. - discussed how weight can impact sleep and risk for sleep disordered breathing - discussed options to assist with weight loss: combination of diet modification, cardiovascular and strength training exercises  Time Spent Involved in Patient Care on Day of Examination:  27 minutes  Follow up:   Patient Instructions  Follow up in 1 year  Medication List:   Allergies as of 05/19/2022   No Known Allergies      Medication List        Accurate as of May 19, 2022  5:13 PM.  If you have any questions, ask your nurse or doctor.          allopurinol 100 MG tablet Commonly known as: ZYLOPRIM   lisinopril-hydrochlorothiazide 10-12.5 MG tablet Commonly known as: ZESTORETIC Take 1 tablet by mouth daily.   Potassium Citrate 15 MEQ (1620 MG) Tbcr        Signature:  Chesley Mires, MD [place] Pager - [phone] 05/19/2022, 5:13 PM

## 2022-05-19 NOTE — Patient Instructions (Signed)
Follow up in 1 year.

## 2022-07-02 IMAGING — DX DG CHEST 2V
2 series · 2 of 2 positions shown · non-contrast
Comparison: X-ray chest 06/15/2010.

CLINICAL DATA: Dyspnea

EXAM:
CHEST - 2 VIEW

[chest pa]
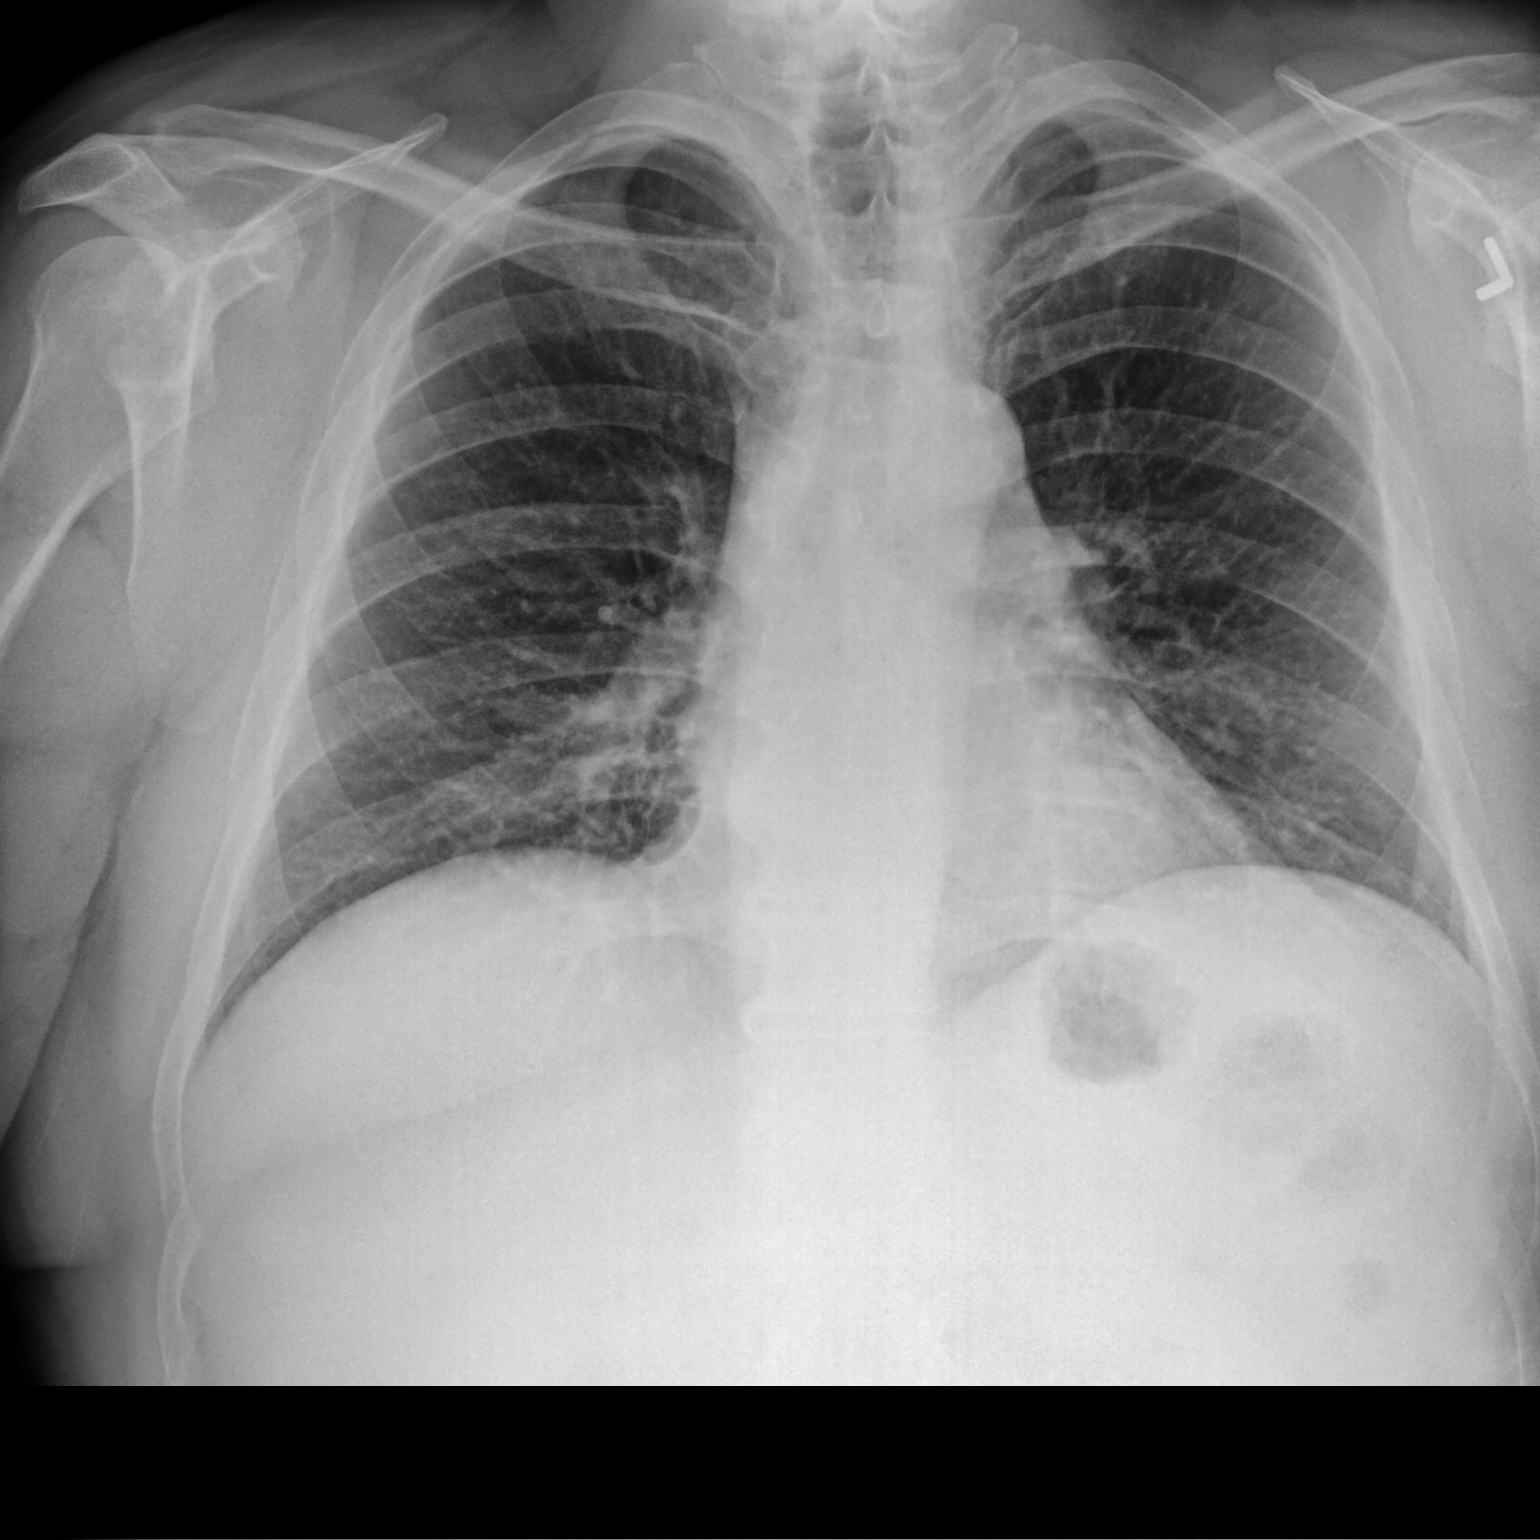

[chest lat]
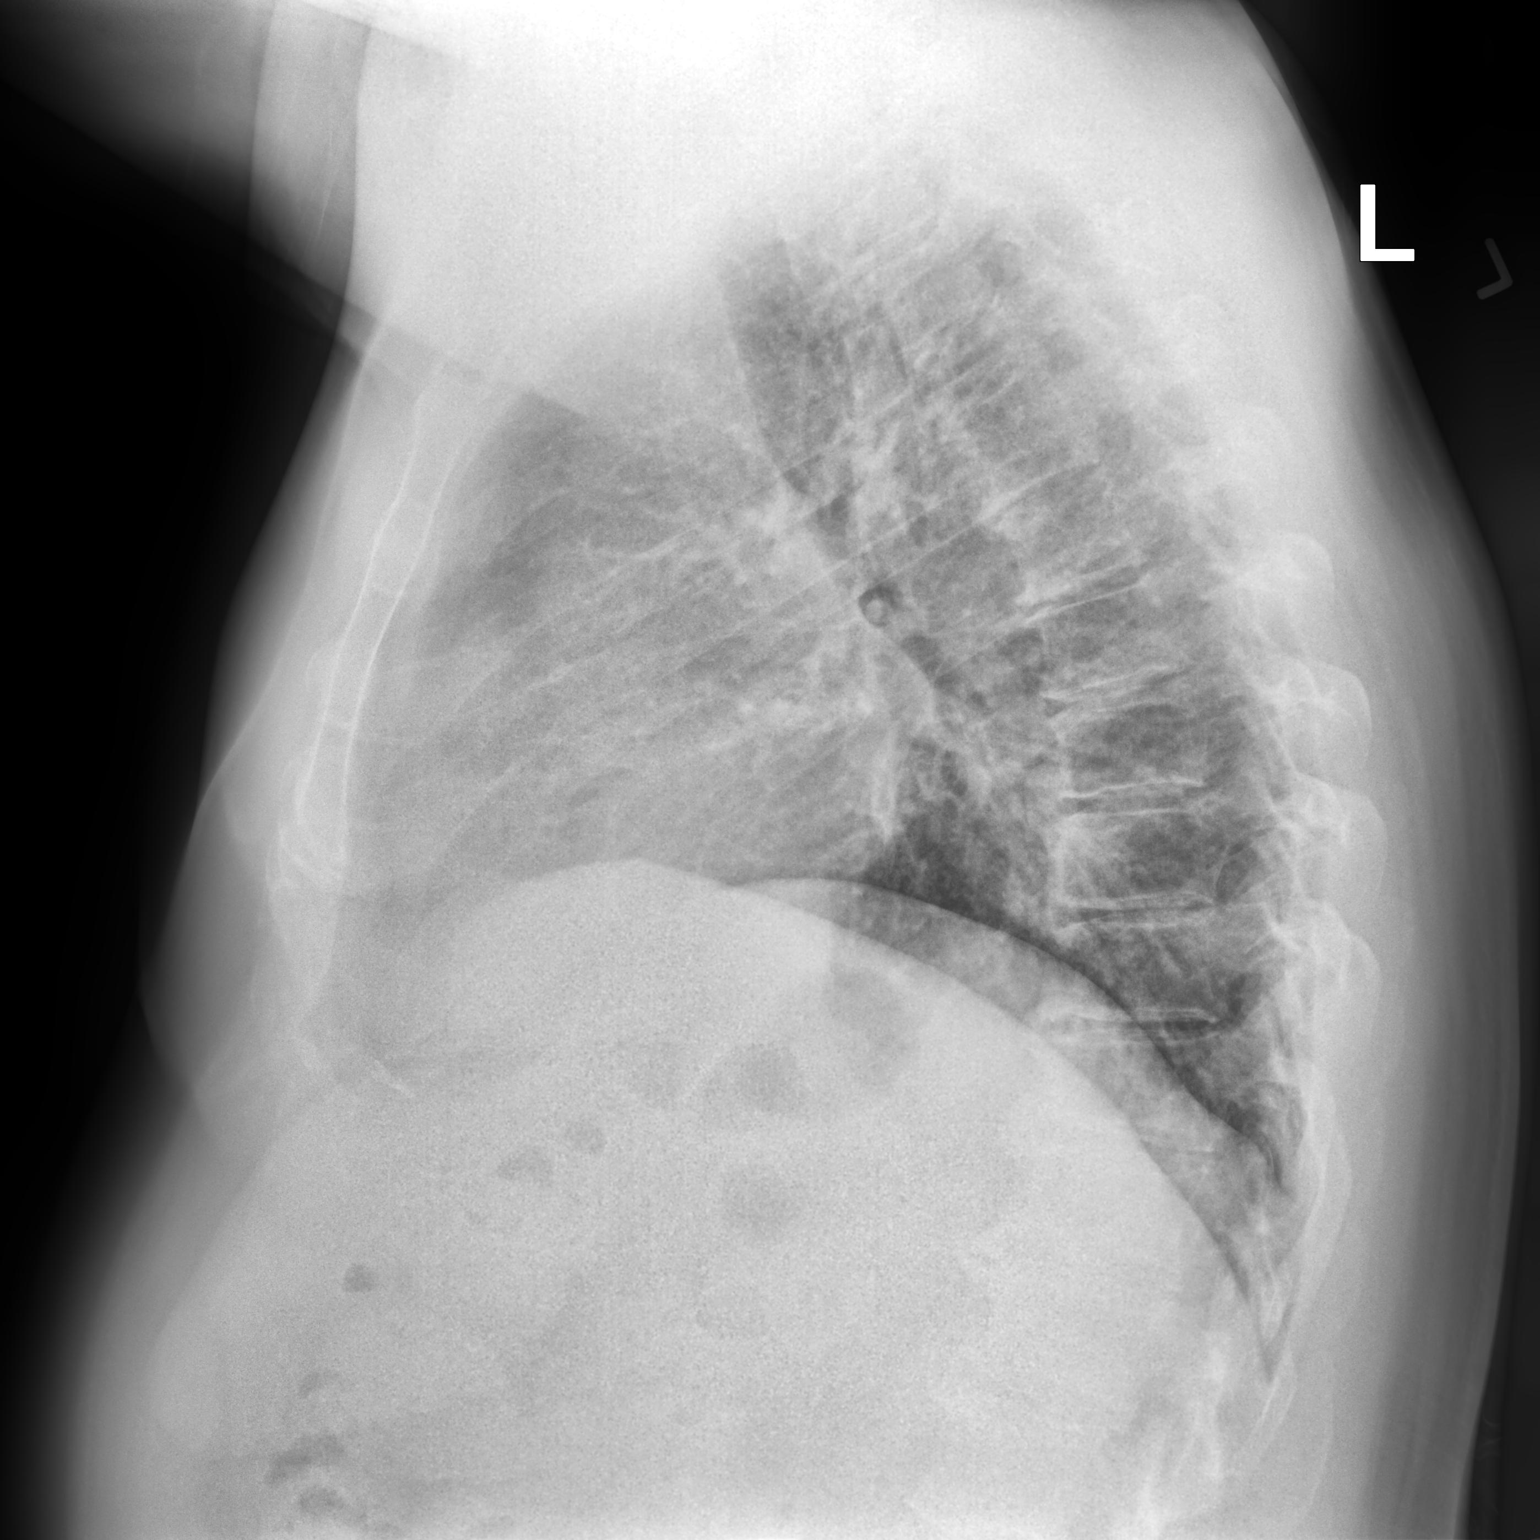

[2 of 2 positions shown; findings below may reference images not displayed]

FINDINGS: Stable cardiomediastinal silhouette with normal heart size. No
pneumothorax. No pleural effusion. Lungs appear clear, with no acute
consolidative airspace disease and no pulmonary edema.
IMPRESSION: No active cardiopulmonary disease.

## 2022-07-17 ENCOUNTER — Emergency Department (HOSPITAL_BASED_OUTPATIENT_CLINIC_OR_DEPARTMENT_OTHER): Payer: Managed Care, Other (non HMO) | Admitting: Radiology

## 2022-07-17 ENCOUNTER — Other Ambulatory Visit: Payer: Self-pay

## 2022-07-17 ENCOUNTER — Emergency Department (HOSPITAL_BASED_OUTPATIENT_CLINIC_OR_DEPARTMENT_OTHER)
Admission: EM | Admit: 2022-07-17 | Discharge: 2022-07-17 | Disposition: A | Discharge status: Home / Self Care | Payer: Managed Care, Other (non HMO) | Attending: Emergency Medicine | Admitting: Emergency Medicine

## 2022-07-17 DIAGNOSIS — S61315A Laceration without foreign body of left ring finger with damage to nail, initial encounter: Secondary | ICD-10-CM | POA: Insufficient documentation

## 2022-07-17 DIAGNOSIS — Z23 Encounter for immunization: Secondary | ICD-10-CM | POA: Insufficient documentation

## 2022-07-17 DIAGNOSIS — W293XXA Contact with powered garden and outdoor hand tools and machinery, initial encounter: Secondary | ICD-10-CM | POA: Diagnosis not present

## 2022-07-17 DIAGNOSIS — S6992XA Unspecified injury of left wrist, hand and finger(s), initial encounter: Secondary | ICD-10-CM | POA: Diagnosis present

## 2022-07-17 MED ORDER — CEPHALEXIN 500 MG PO CAPS: 500.0000 mg | ORAL_CAPSULE | Freq: Three times a day (TID) | ORAL | 0 refills | Status: AC

## 2022-07-17 MED ORDER — LIDOCAINE HCL (PF) 1 % IJ SOLN
10.0000 mL | Freq: Once | INTRAMUSCULAR | Status: AC
Start: 2022-07-17 — End: 2022-07-17
  Administered 2022-07-17: 10 mL
  Filled 2022-07-17: qty 10

## 2022-07-17 MED ORDER — OXYCODONE-ACETAMINOPHEN 5-325 MG PO TABS
1.0000 | ORAL_TABLET | ORAL | Status: DC | PRN
  Administered 2022-07-17: 1 via ORAL
  Filled 2022-07-17: qty 1

## 2022-07-17 MED ORDER — TETANUS-DIPHTH-ACELL PERTUSSIS 5-2.5-18.5 LF-MCG/0.5 IM SUSY
0.5000 mL | PREFILLED_SYRINGE | Freq: Once | INTRAMUSCULAR | Status: AC
  Administered 2022-07-17: 0.5 mL via INTRAMUSCULAR
  Filled 2022-07-17: qty 0.5

## 2022-07-17 NOTE — ED Provider Notes (Signed)
[facility] EM[facility]ENC[facility] DEPT Provider Note   CSN: [id number] Arrival date & time: 07/17/22  1327     History  Chief Complaint  Patient presents with   Hand Injury    Left   Extremity Laceration    Vincent Grant is a 51 y.o. male.  51 year old male presents today for evaluation of laceration to left ring finger.  Patient was using a hedge tremor when his hand slipped.  He is unsure of his last tetanus shot.  Patient is right-hand dominant.  This occurred couple hours prior to arrival.  Denies other injury.  The history is provided by the patient. No language interpreter was used.       Home Medications Prior to Admission medications   Medication Sig Start Date End Date Taking? Authorizing Provider  allopurinol (ZYLOPRIM) 100 MG tablet  01/22/18   [provider]  lisinopril-hydrochlorothiazide (ZESTORETIC) 10-12.5 MG tablet Take 1 tablet by mouth daily. 08/27/21   Laurey Morale, MD  Potassium Citrate 15 MEQ (1620 MG) TBCR  11/29/17   [provider]      Allergies    Patient has no known allergies.    Review of Systems   Review of Systems  Constitutional:  Negative for chills and fever.  Skin:  Positive for wound.  All other systems reviewed and are negative.   Physical Exam Updated Vital Signs BP (!) 133/98 (BP Location: Right Arm)   Pulse 90   Temp 98 F (36.7 C)   Resp 18   Ht '6\' 2"'$  (1.88 m)   Wt 117.9 kg   SpO2 97%   BMI 33.38 kg/m  Physical Exam Vitals and nursing note reviewed.  Constitutional:      General: He is not in acute distress.    Appearance: Normal appearance. He is not ill-appearing.  HENT:     Head: Normocephalic and atraumatic.     Nose: Nose normal.  Eyes:     Conjunctiva/sclera: Conjunctivae normal.  Pulmonary:     Effort: Pulmonary effort is normal. No respiratory distress.  Musculoskeletal:        General: No deformity.     Comments: Laceration to left ring finger noted.  Mild nailbed injury noted  as well.  Please see attached picture for description.  2+ radial pulse present.  Brisk cap refill.  Sensation intact in all digits of the left hand including left ring finger.  Skin:    Findings: No rash.  Neurological:     Mental Status: He is alert.     ED Results / Procedures / Treatments   Labs (all labs ordered are listed, but only abnormal results are displayed) Labs Reviewed - No data to display  EKG None  Radiology DG Hand Complete Left  Result Date: 07/17/2022 CLINICAL DATA:  Trauma EXAM: LEFT HAND - COMPLETE 3+ VIEW COMPARISON:  None Available. FINDINGS: No recent fracture or dislocation is seen. There are no opaque foreign bodies. Radiopaque dressing is noted around the distal portion of ring finger. IMPRESSION: No recent fracture or dislocation is seen in left hand. Electronically Signed   By: Elmer Picker M.D.   On: 07/17/2022 15:15    Procedures .Marland KitchenLaceration Repair  Date/Time: 07/17/2022 5:06 PM  Performed by: Evlyn Courier, PA-C Authorized by: Evlyn Courier, PA-C   Consent:    Consent obtained:  Verbal   Consent given by:  Patient   Risks discussed:  Infection, need for additional repair, pain, poor cosmetic result and poor wound healing  Alternatives discussed:  No treatment and delayed treatment Universal protocol:    Procedure explained and questions answered to patient or proxy's satisfaction: yes     Relevant documents present and verified: yes     Test results available: yes     Patient identity confirmed:  Verbally with patient Anesthesia:    Anesthesia method:  Local infiltration Laceration details:    Location:  Finger   Finger location:  L ring finger   Length (cm):  2 Pre-procedure details:    Preparation:  Patient was prepped and draped in usual sterile fashion Exploration:    Imaging obtained: x-ray     Imaging outcome: foreign body not noted     Wound extent: no foreign bodies/material noted, no muscle damage noted, no nerve damage noted  and no tendon damage noted   Treatment:    Area cleansed with:  Povidone-iodine and saline   Amount of cleaning:  Extensive   Irrigation volume:  500   Irrigation method:  Pressure wash and tap   Debridement:  None   Undermining:  None Skin repair:    Repair method:  Sutures   Suture size:  5-0   Suture material:  Prolene   Suture technique:  Simple interrupted   Number of sutures:  7 Approximation:    Approximation:  Close Repair type:    Repair type:  Simple Post-procedure details:    Dressing: pressure dressing.   Procedure completion:  Tolerated well, no immediate complications     Medications Ordered in ED Medications  oxyCODONE-acetaminophen (PERCOCET/ROXICET) 5-325 MG per tablet 1 tablet (1 tablet Oral Given 07/17/22 1410)  lidocaine (PF) (XYLOCAINE) 1 % injection 10 mL (10 mLs Infiltration Given 07/17/22 1545)  Tdap (BOOSTRIX) injection 0.5 mL (0.5 mLs Intramuscular Given 07/17/22 1545)    ED Course/ Medical Decision Making/ A&P                           Medical Decision Making Amount and/or Complexity of Data Reviewed Radiology: ordered.  Risk Prescription drug management.   51 year old male presents today for evaluation of laceration on left ring finger.  Occurred couple hours prior to arrival while patient was using a hedge tremor and his finger slipped onto the blade.  X-ray demonstrates no evidence of fracture, retained foreign body. See attached picture for laceration description.  Laceration repaired using 7 5-0 Prolene sutures.  Keflex prescribed.  Return precautions discussed.  Patient voices understanding and is in agreement with plan.  Discussed these will need to be removed in 7 days.  Discussed use of Tylenol and Motrin for pain control.   Final Clinical Impression(s) / ED Diagnoses Final diagnoses:  Laceration of left ring finger with damage to nail, foreign body presence unspecified, initial encounter    Rx / DC Orders ED Discharge Orders           Ordered    cephALEXin (KEFLEX) 500 MG capsule  3 times daily        07/17/22 1713              Evlyn Courier, PA-C 07/17/22 1714    Dorie Rank, MD 07/18/22 2041

## 2022-07-17 NOTE — ED Triage Notes (Addendum)
Patient arrives with complaints of sustaining a left hand/ring finger laceration while using an Chief of Staff.   Rates pain a 4/10.  ED Tech noted moderated amount of bleeding to site on arrival. Bandage placed on arrival.

## 2022-07-17 NOTE — ED Notes (Signed)
Patient Alert and oriented to baseline. Stable and ambulatory to baseline. Patient verbalized understanding of the discharge instructions.  Patient belongings were taken by the patient.   

## 2022-07-17 NOTE — Discharge Instructions (Addendum)
Your laceration was repaired today.  The sutures will need to be removed in about 7 days.  You can go to your primary care, urgent care, or return to the emergency room to have these taken out.  Your tetanus was updated today.  Take Tylenol and Motrin as you need to for pain control.  For any signs of infection such as worsening swelling, purulent drainage, redness, or fever please return to the emergency room.

## 2022-07-19 ENCOUNTER — Encounter: Payer: Self-pay | Admitting: Family Medicine

## 2022-07-19 MED ORDER — TRAMADOL HCL 50 MG PO TABS: 100.0000 mg | ORAL_TABLET | Freq: Four times a day (QID) | ORAL | 0 refills | Status: AC | PRN

## 2022-07-19 NOTE — Telephone Encounter (Signed)
I sent in some Tramadol he can try for a few days.

## 2022-07-26 ENCOUNTER — Encounter: Payer: Self-pay | Admitting: Family Medicine

## 2022-07-26 ENCOUNTER — Ambulatory Visit (INDEPENDENT_AMBULATORY_CARE_PROVIDER_SITE_OTHER): Payer: Managed Care, Other (non HMO) | Admitting: Family Medicine

## 2022-07-26 VITALS — BP 136/86 | HR 70 | Temp 98.2°F | Ht 74.0 in | Wt 271.6 lb

## 2022-07-26 DIAGNOSIS — S61215D Laceration without foreign body of left ring finger without damage to nail, subsequent encounter: Secondary | ICD-10-CM | POA: Diagnosis not present

## 2022-07-26 NOTE — Progress Notes (Signed)
   Established Patient Office Visit  Subjective   Patient ID: Vincent Grant, male    DOB: 1971/11/16  Age: 51 y.o. MRN: [id number]  Chief Complaint  Patient presents with   Suture / Staple Removal    HPI   Here for suture removal from recent injury left ring finger laceration.  Injury occurred on the 19th.  He was using a hedge tremor when his hand slipped and hit the blade.  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  He did receive tetanus booster.  He is right-hand dominant.  No other injuries.  7 sutures were placed.  No foreign bodies or other acute abnormalities noted on x-ray  Past Medical History:  Diagnosis Date   Hypertension    Kidney stones    Past Surgical History:  Procedure Laterality Date   COLONOSCOPY  09/24/2020   per Dr. Tarri Glenn, tubular adenomas, repeat in 3 yrs   KIDNEY STONE SURGERY Right 11/29/2009   per Dr. Jeffie Pollock   TONSILLECTOMY AND ADENOIDECTOMY     adenoids as a child    reports that he has been smoking cigarettes. He has a 30.00 pack-year smoking history. He has never used smokeless tobacco. He reports current alcohol use of about 2.0 standard drinks of alcohol per week. He reports that he does not use drugs. family history includes Cancer in his son; Colon polyps in his father. No Known Allergies  Review of Systems  Constitutional:  Negative for chills and fever.      Objective:     BP 136/86 (BP Location: Left Arm, Patient Position: Sitting, Cuff Size: Normal)   Pulse 70   Temp 98.2 F (36.8 C) (Oral)   Ht '6\' 2"'$  (1.88 m)   Wt 271 lb 9.6 oz (123.2 kg)   SpO2 98%   BMI 34.87 kg/m    Physical Exam Vitals reviewed.  Constitutional:      Appearance: Normal appearance.  Skin:    Comments: He has healing laceration left ring finger which extends from distal aspect of the finger proximally about 1 and half centimeters.  7 sutures are removed without difficulty.  Patient tolerated well.  He has a bit of crusting at the wound edge but no signs of  infection.  No erythema.  No purulent drainage.  Neurological:     Mental Status: He is alert.      No results found for any visits on 07/26/22.    The 10-year ASCVD risk score (Arnett DK, et al., 2019) is: 15.4%    Assessment & Plan:   Laceration left ring finger.  Healing well.  No signs of secondary infection.  7 sutures removed.  Recommend he clean daily with soap and water.  Apply some Vaseline and keep covered for a few more days.  Follow-up promptly for any signs of secondary flexion.  No follow-ups on file.    Carolann Littler, MD

## 2022-07-26 NOTE — Patient Instructions (Signed)
Clean left ring finger daily with soap and water  Consider using some topical Vaseline daily for the next week  Follow-up promptly for any signs of infection such as redness, swelling, puslike drainage

## 2022-08-09 ENCOUNTER — Encounter: Payer: Self-pay | Admitting: Family Medicine

## 2022-08-09 ENCOUNTER — Ambulatory Visit (INDEPENDENT_AMBULATORY_CARE_PROVIDER_SITE_OTHER): Payer: Managed Care, Other (non HMO) | Admitting: Family Medicine

## 2022-08-09 VITALS — BP 110/78 | HR 88 | Temp 98.6°F | Ht 73.0 in | Wt 267.0 lb

## 2022-08-09 DIAGNOSIS — Z0000 Encounter for general adult medical examination without abnormal findings: Secondary | ICD-10-CM

## 2022-08-09 LAB — BASIC METABOLIC PANEL
BUN: 17 mg/dL (ref 6–23)
CO2: 29 mEq/L (ref 19–32)
Calcium: 9.7 mg/dL (ref 8.4–10.5)
Chloride: 101 mEq/L (ref 96–112)
Creatinine, Ser: 1.09 mg/dL (ref 0.40–1.50)
GFR: 77.75 mL/min (ref >60.00)
Glucose, Bld: 81 mg/dL (ref 70–99)
Potassium: 3.7 mEq/L (ref 3.5–5.1)
Sodium: 139 mEq/L (ref 135–145)

## 2022-08-09 LAB — LIPID PANEL
Cholesterol: 183 mg/dL (ref 0–200)
HDL: 31.9 mg/dL — ABNORMAL LOW (ref >39.00)
NonHDL: 151.13
Total CHOL/HDL Ratio: 6
Triglycerides: 201 mg/dL — ABNORMAL HIGH (ref 0.0–149.0)
VLDL: 40.2 mg/dL — ABNORMAL HIGH (ref 0.0–40.0)

## 2022-08-09 LAB — CBC WITH DIFFERENTIAL/PLATELET
Basophils Absolute: 0 10*3/uL (ref 0.0–0.1)
Basophils Relative: 0.6 % (ref 0.0–3.0)
Eosinophils Absolute: 0.1 10*3/uL (ref 0.0–0.7)
Eosinophils Relative: 1.3 % (ref 0.0–5.0)
HCT: 45.1 % (ref 39.0–52.0)
Hemoglobin: 15.7 g/dL (ref 13.0–17.0)
Lymphocytes Relative: 37.1 % (ref 12.0–46.0)
Lymphs Abs: 2.4 10*3/uL (ref 0.7–4.0)
MCHC: 34.7 g/dL (ref 30.0–36.0)
MCV: 86.6 fl (ref 78.0–100.0)
Monocytes Absolute: 0.5 10*3/uL (ref 0.1–1.0)
Monocytes Relative: 7.2 % (ref 3.0–12.0)
Neutro Abs: 3.5 10*3/uL (ref 1.4–7.7)
Neutrophils Relative %: 53.8 % (ref 43.0–77.0)
Platelets: 163 10*3/uL (ref 150.0–400.0)
RBC: 5.22 Mil/uL (ref 4.22–5.81)
RDW: 13.2 % (ref 11.5–15.5)
WBC: 6.4 10*3/uL (ref 4.0–10.5)

## 2022-08-09 LAB — HEPATIC FUNCTION PANEL
ALT: 24 U/L (ref 0–53)
AST: 24 U/L (ref 0–37)
Albumin: 4.4 g/dL (ref 3.5–5.2)
Alkaline Phosphatase: 70 U/L (ref 39–117)
Bilirubin, Direct: 0.1 mg/dL (ref 0.0–0.3)
Total Bilirubin: 0.6 mg/dL (ref 0.2–1.2)
Total Protein: 7.7 g/dL (ref 6.0–8.3)

## 2022-08-09 LAB — HEMOGLOBIN A1C: Hgb A1c MFr Bld: 5.8 % (ref 4.6–6.5)

## 2022-08-09 LAB — TSH: TSH: 0.66 u[IU]/mL (ref 0.35–5.50)

## 2022-08-09 LAB — LDL CHOLESTEROL, DIRECT: Direct LDL: 128 mg/dL

## 2022-08-09 NOTE — Progress Notes (Signed)
Subjective:    Patient ID: Vincent Grant, male    DOB: 1970/12/25, 51 y.o.   MRN: [id number]  HPI Here for a well exam. He feels well. He was diagnosed with sleep apnea a few months ago, and he was started on a CPAP machine. He says he sleeps better and he feels more alert during the daytime. He still sees Urology yearly for prostate exams, and they check his PSA and uric acid level.    Review of Systems  Constitutional: Negative.   HENT: Negative.    Eyes: Negative.   Respiratory: Negative.    Cardiovascular: Negative.   Gastrointestinal: Negative.   Genitourinary: Negative.   Musculoskeletal: Negative.   Skin: Negative.   Neurological: Negative.   Psychiatric/Behavioral: Negative.         Objective:   Physical Exam Constitutional:      General: He is not in acute distress.    Appearance: Normal appearance. He is well-developed. He is not diaphoretic.  HENT:     Head: Normocephalic and atraumatic.     Right Ear: External ear normal.     Left Ear: External ear normal.     Nose: Nose normal.     Mouth/Throat:     Pharynx: No oropharyngeal exudate.  Eyes:     General: No scleral icterus.       Right eye: No discharge.        Left eye: No discharge.     Conjunctiva/sclera: Conjunctivae normal.     Pupils: Pupils are equal, round, and reactive to light.  Neck:     Thyroid: No thyromegaly.     Vascular: No JVD.     Trachea: No tracheal deviation.  Cardiovascular:     Rate and Rhythm: Normal rate and regular rhythm.     Heart sounds: Normal heart sounds. No murmur heard.    No friction rub. No gallop.  Pulmonary:     Effort: Pulmonary effort is normal. No respiratory distress.     Breath sounds: Normal breath sounds. No wheezing or rales.  Chest:     Chest wall: No tenderness.  Abdominal:     General: Bowel sounds are normal. There is no distension.     Palpations: Abdomen is soft. There is no mass.     Tenderness: There is no abdominal tenderness. There is no  guarding or rebound.  Genitourinary:    Penis: No tenderness.   Musculoskeletal:        General: No tenderness. Normal range of motion.     Cervical back: Neck supple.  Lymphadenopathy:     Cervical: No cervical adenopathy.  Skin:    General: Skin is warm and dry.     Coloration: Skin is not pale.     Findings: No erythema or rash.  Neurological:     Mental Status: He is alert and oriented to person, place, and time.     Cranial Nerves: No cranial nerve deficit.     Motor: No abnormal muscle tone.     Coordination: Coordination normal.     Deep Tendon Reflexes: Reflexes are normal and symmetric. Reflexes normal.  Psychiatric:        Behavior: Behavior normal.        Thought Content: Thought content normal.        Judgment: Judgment normal.           Assessment & Plan:  Well exam. We discussed diet and exercise. Get fasting labs. Alysia Penna, MD

## 2022-08-12 ENCOUNTER — Telehealth: Payer: Self-pay

## 2022-08-12 NOTE — Telephone Encounter (Signed)
Left pt a message advised to pick up Physician form from the office.

## 2022-09-03 ENCOUNTER — Other Ambulatory Visit: Payer: Self-pay | Admitting: Family Medicine

## 2023-06-01 ENCOUNTER — Other Ambulatory Visit: Payer: Self-pay | Admitting: Family Medicine

## 2023-07-22 ENCOUNTER — Ambulatory Visit: Payer: 59 | Admitting: Primary Care

## 2023-07-22 ENCOUNTER — Encounter: Payer: Self-pay | Admitting: Primary Care

## 2023-07-22 VITALS — BP 110/70 | HR 68 | Ht 74.0 in | Wt 250.4 lb

## 2023-07-22 DIAGNOSIS — G4733 Obstructive sleep apnea (adult) (pediatric): Secondary | ICD-10-CM

## 2023-07-22 NOTE — Patient Instructions (Addendum)
Aim to wear CPAP every night minimum 4 hours or longer  Order: Size large full face mask (ordered)   Follow-up 6 months with Beth NP (Can be virtual)    CPAP and BIPAP Information CPAP and BIPAP are methods that use air pressure to keep your airways open and to help you breathe well. CPAP and BIPAP use different amounts of pressure. Your health care provider will tell you whether CPAP or BIPAP would be more helpful for you. CPAP stands for "continuous positive airway pressure." With CPAP, the amount of pressure stays the same while you breathe in (inhale) and out (exhale). BIPAP stands for "bi-level positive airway pressure." With BIPAP, the amount of pressure will be higher when you inhale and lower when you exhale. This allows you to take larger breaths. CPAP or BIPAP may be used in the hospital, or your health care provider may want you to use it at home. You may need to have a sleep study before your health care provider can order a machine for you to use at home. What are the advantages? CPAP or BIPAP can be helpful if you have: Sleep apnea. Chronic obstructive pulmonary disease (COPD). Heart failure. Medical conditions that cause muscle weakness, including muscular dystrophy or amyotrophic lateral sclerosis (ALS). Other problems that cause breathing to be shallow, weak, abnormal, or difficult. CPAP and BIPAP are most commonly used for obstructive sleep apnea (OSA) to keep the airways from collapsing when the muscles relax during sleep. What are the risks? Generally, this is a safe treatment. However, problems may occur, including: Irritated skin or skin sores if the mask does not fit properly. Dry or stuffy nose or nosebleeds. Dry mouth. Feeling gassy or bloated. Sinus or lung infection if the equipment is not cleaned properly. When should CPAP or BIPAP be used? In most cases, the mask only needs to be worn during sleep. Generally, the mask needs to be worn throughout the night  and during any daytime naps. People with certain medical conditions may also need to wear the mask at other times, such as when they are awake. Follow instructions from your health care provider about when to use the machine. What happens during CPAP or BIPAP?  Both CPAP and BIPAP are provided by a small machine with a flexible plastic tube that attaches to a plastic mask that you wear. Air is blown through the mask into your nose or mouth. The amount of pressure that is used to blow the air can be adjusted on the machine. Your health care provider will set the pressure setting and help you find the best mask for you. Tips for using the mask Because the mask needs to be snug, some people feel trapped or closed-in (claustrophobic) when first using the mask. If you feel this way, you may need to get used to the mask. One way to do this is to hold the mask loosely over your nose or mouth and then gradually apply the mask more snugly. You can also gradually increase the amount of time that you use the mask. Masks are available in various types and sizes. If your mask does not fit well, talk with your health care provider about getting a different one. Some common types of masks include: Full face masks, which fit over the mouth and nose. Nasal masks, which fit over the nose. Nasal pillow or prong masks, which fit into the nostrils. If you are using a mask that fits over your nose and you tend to  breathe through your mouth, a chin strap may be applied to help keep your mouth closed. Use a skin barrier to protect your skin as told by your health care provider. Some CPAP and BIPAP machines have alarms that may sound if the mask comes off or develops a leak. If you have trouble with the mask, it is very important that you talk with your health care provider about finding a way to make the mask easier to tolerate. Do not stop using the mask. There could be a negative impact on your health if you stop using the  mask. Tips for using the machine Place your CPAP or BIPAP machine on a secure table or stand near an electrical outlet. Know where the on/off switch is on the machine. Follow instructions from your health care provider about how to set the pressure on your machine and when you should use it. Do not eat or drink while the CPAP or BIPAP machine is on. Food or fluids could get pushed into your lungs by the pressure of the CPAP or BIPAP. For home use, CPAP and BIPAP machines can be rented or purchased through home health care companies. Many different brands of machines are available. Renting a machine before purchasing may help you find out which particular machine works well for you. Your health insurance company may also decide which machine you may get. Keep the CPAP or BIPAP machine and attachments clean. Ask your health care provider for specific instructions. Check the humidifier if you have a dry stuffy nose or nosebleeds. Make sure it is working correctly. Follow these instructions at home: Take over-the-counter and prescription medicines only as told by your health care provider. Ask if you can take sinus medicine if your sinuses are blocked. Do not use any products that contain nicotine or tobacco. These products include cigarettes, chewing tobacco, and vaping devices, such as e-cigarettes. If you need help quitting, ask your health care provider. Keep all follow-up visits. This is important. Contact a health care provider if: You have redness or pressure sores on your head, face, mouth, or nose from the mask or head gear. You have trouble using the CPAP or BIPAP machine. You cannot tolerate wearing the CPAP or BIPAP mask. Someone tells you that you snore even when wearing your CPAP or BIPAP. Get help right away if: You have trouble breathing. You feel confused. Summary CPAP and BIPAP are methods that use air pressure to keep your airways open and to help you breathe well. If you have  trouble with the mask, it is very important that you talk with your health care provider about finding a way to make the mask easier to tolerate. Do not stop using the mask. There could be a negative impact to your health if you stop using the mask. Follow instructions from your health care provider about when to use the machine. This information is not intended to replace advice given to you by your health care provider. Make sure you discuss any questions you have with your health care provider. Document Revised: 06/24/2021 Document Reviewed: 10/24/2020 Elsevier Patient Education  2023 [employer].

## 2023-07-22 NOTE — Progress Notes (Signed)
@Patient  ID: Vincent Grant, male    DOB: 1971/03/07, 52 y.o.   MRN: [id number]  Chief Complaint  Patient presents with   Follow-up    OSA    Referring provider: Nelwyn Salisbury, MD  HPI: 52 year old male, current everyday smoker.  Medical history significant for severe obstructive sleep apnea, tobacco abuse. Patient of Dr. Craige Cotta.  07/22/2023 Patient presents today for 1 year follow-up for OSA. Compliance with CPAP is moderate. He is away on weekends and does not always have electricity. Other times he does not wear the CPAP due to congestion. He does report benefit from CPAP use. Feels mask may be too small, wears size medium full face mask. Weight is down 13 lbs  Airview download 06/21/2023 - 07/20/2023 Usage days 19/30 days (63%); 18 days (60%) greater than 4 hours Average usage days used 5 hours 37 minutes Pressure 5 to 20 cm H2O (16.0 cm H2O-95%) Air leaks 7.1 L/min (95%) AHI 2.6   No Known Allergies  Immunization History  Administered Date(s) Administered   Influenza, Quadrivalent, Recombinant, Inj, Pf 01/28/2018   Influenza,inj,Quad PF,6+ Mos 08/27/2021   Influenza-Unspecified 09/28/2015   PFIZER(Purple Top)SARS-COV-2 Vaccination 02/16/2020, 03/10/2020, 10/15/2020, 09/11/2021   Tdap 03/06/2018, 07/17/2022   Zoster Recombinant(Shingrix) 11/04/2020, 01/14/2021    Past Medical History:  Diagnosis Date   Hypertension    Kidney stones    Sleep apnea, obstructive    sees Dr. Craige Cotta    Tobacco History: Social History   Tobacco Use  Smoking Status Every Day   Current packs/day: 1.00   Average packs/day: 1 pack/day for 30.0 years (30.0 ttl pk-yrs)   Types: Cigarettes  Smokeless Tobacco Never  Tobacco Comments   1/2 pack each day   Ready to quit: Not Answered Counseling given: Not Answered Tobacco comments: 1/2 pack each day   Outpatient Medications Prior to Visit  Medication Sig Dispense Refill   allopurinol (ZYLOPRIM) 100 MG tablet       lisinopril-hydrochlorothiazide (ZESTORETIC) 10-12.5 MG tablet TAKE 1 TABLET BY MOUTH DAILY 90 tablet 0   Potassium Citrate 15 MEQ (1620 MG) TBCR      No facility-administered medications prior to visit.   Review of Systems  Review of Systems  Constitutional: Negative.   HENT:  Positive for congestion and postnasal drip.   Respiratory: Negative.    Cardiovascular: Negative.    Physical Exam  BP 110/70 (BP Location: Left Arm, Cuff Size: Large)   Pulse 68   Ht 6\' 2"  (1.88 m)   Wt 250 lb 6.4 oz (113.6 kg)   SpO2 100%   BMI 32.15 kg/m  Physical Exam Constitutional:      Appearance: Normal appearance.  HENT:     Head: Normocephalic and atraumatic.     Mouth/Throat:     Mouth: Mucous membranes are moist.     Pharynx: Oropharynx is clear.  Cardiovascular:     Rate and Rhythm: Normal rate and regular rhythm.  Musculoskeletal:        General: Normal range of motion.  Skin:    General: Skin is warm and dry.  Neurological:     General: No focal deficit present.     Mental Status: He is alert and oriented to person, place, and time. Mental status is at baseline.  Psychiatric:        Mood and Affect: Mood normal.        Behavior: Behavior normal.        Thought Content: Thought content normal.  Judgment: Judgment normal.      Lab Results:  CBC    Component Value Date/Time   WBC 6.4 08/09/2022 1323   RBC 5.22 08/09/2022 1323   HGB 15.7 08/09/2022 1323   HCT 45.1 08/09/2022 1323   PLT 163.0 08/09/2022 1323   MCV 86.6 08/09/2022 1323   MCH 29.9 07/21/2020 0901   MCHC 34.7 08/09/2022 1323   RDW 13.2 08/09/2022 1323   LYMPHSABS 2.4 08/09/2022 1323   MONOABS 0.5 08/09/2022 1323   EOSABS 0.1 08/09/2022 1323   BASOSABS 0.0 08/09/2022 1323    BMET    Component Value Date/Time   NA 139 08/09/2022 1323   K 3.7 08/09/2022 1323   CL 101 08/09/2022 1323   CO2 29 08/09/2022 1323   GLUCOSE 81 08/09/2022 1323   BUN 17 08/09/2022 1323   CREATININE 1.09 08/09/2022  1323   CREATININE 0.95 07/21/2020 0901   CALCIUM 9.7 08/09/2022 1323   GFRNONAA >60 06/15/2010 0750   GFRAA  06/15/2010 0750    >60        The eGFR has been calculated using the MDRD equation. This calculation has not been validated in all clinical situations. eGFR's persistently <60 mL/min signify possible Chronic Kidney Disease.    BNP No results found for: "BNP"  ProBNP No results found for: "PROBNP"  Imaging: No results found.   Assessment & Plan:   Severe obstructive sleep apnea - CPAP compliance 60% greater than 4 hours over the last 30 days.  He travels on weekends and does not always have electricity.  Other times he is unable to wear CPAP due to sinus congestion.  Current pressure 5 to 20 cm H2O; residual AHI 2.6/h.  Encourage patient aim to wear CPAP nightly minimum of 4 hours or longer.  Unable to wear CPAP encourage patient focus on side sleeping position or elevate head of bed 30 degrees.  Avoid alcohol or sedatives prior to bedtime as these can worsen underlying sleep apnea.  Advised against driving if experiencing excessive daytime sleepiness fatigue.  DME order placed for patient to be fitted for size large fullface mask.  Follow-up in 6 months for compliance check.  Glenford Bayley, NP 07/30/2023

## 2023-07-30 NOTE — Assessment & Plan Note (Signed)
-   CPAP compliance 60% greater than 4 hours over the last 30 days.  He travels on weekends and does not always have electricity.  Other times he is unable to wear CPAP due to sinus congestion.  Current pressure 5 to 20 cm H2O; residual AHI 2.6/h.  Encourage patient aim to wear CPAP nightly minimum of 4 hours or longer.  Unable to wear CPAP encourage patient focus on side sleeping position or elevate head of bed 30 degrees.  Avoid alcohol or sedatives prior to bedtime as these can worsen underlying sleep apnea.  Advised against driving if experiencing excessive daytime sleepiness fatigue.  DME order placed for patient to be fitted for size large fullface mask.  Follow-up in 6 months for compliance check.

## 2023-08-01 NOTE — Progress Notes (Signed)
Reviewed and agree with assessment/plan.   Coralyn Helling, MD [facility] Pulmonary/Critical Care 08/01/2023, 7:06 AM Pager:  [phone]

## 2023-08-12 ENCOUNTER — Ambulatory Visit (INDEPENDENT_AMBULATORY_CARE_PROVIDER_SITE_OTHER): Payer: 59 | Admitting: Family Medicine

## 2023-08-12 VITALS — BP 100/70 | HR 66 | Temp 98.3°F | Ht 73.25 in | Wt 253.0 lb

## 2023-08-12 DIAGNOSIS — Z86010 Personal history of colonic polyps: Secondary | ICD-10-CM

## 2023-08-12 DIAGNOSIS — Z0000 Encounter for general adult medical examination without abnormal findings: Secondary | ICD-10-CM

## 2023-08-12 LAB — LIPID PANEL
Cholesterol: 170 mg/dL (ref 0–200)
HDL: 34.1 mg/dL — ABNORMAL LOW (ref >39.00)
LDL Cholesterol: 98 mg/dL (ref 0–99)
NonHDL: 136.31
Total CHOL/HDL Ratio: 5
Triglycerides: 192 mg/dL — ABNORMAL HIGH (ref 0.0–149.0)
VLDL: 38.4 mg/dL (ref 0.0–40.0)

## 2023-08-12 LAB — BASIC METABOLIC PANEL
BUN: 19 mg/dL (ref 6–23)
CO2: 33 meq/L — ABNORMAL HIGH (ref 19–32)
Calcium: 9.5 mg/dL (ref 8.4–10.5)
Chloride: 101 meq/L (ref 96–112)
Creatinine, Ser: 1.06 mg/dL (ref 0.40–1.50)
GFR: 79.83 mL/min (ref >60.00)
Glucose, Bld: 93 mg/dL (ref 70–99)
Potassium: 4.4 meq/L (ref 3.5–5.1)
Sodium: 141 meq/L (ref 135–145)

## 2023-08-12 LAB — CBC WITH DIFFERENTIAL/PLATELET
Basophils Absolute: 0.1 10*3/uL (ref 0.0–0.1)
Basophils Relative: 0.8 % (ref 0.0–3.0)
Eosinophils Absolute: 0.1 10*3/uL (ref 0.0–0.7)
Eosinophils Relative: 1.5 % (ref 0.0–5.0)
HCT: 48.1 % (ref 39.0–52.0)
Hemoglobin: 15.9 g/dL (ref 13.0–17.0)
Lymphocytes Relative: 34 % (ref 12.0–46.0)
Lymphs Abs: 2.2 10*3/uL (ref 0.7–4.0)
MCHC: 33.1 g/dL (ref 30.0–36.0)
MCV: 89 fl (ref 78.0–100.0)
Monocytes Absolute: 0.5 10*3/uL (ref 0.1–1.0)
Monocytes Relative: 8.4 % (ref 3.0–12.0)
Neutro Abs: 3.5 10*3/uL (ref 1.4–7.7)
Neutrophils Relative %: 55.3 % (ref 43.0–77.0)
Platelets: 172 10*3/uL (ref 150.0–400.0)
RBC: 5.4 Mil/uL (ref 4.22–5.81)
RDW: 13.6 % (ref 11.5–15.5)
WBC: 6.4 10*3/uL (ref 4.0–10.5)

## 2023-08-12 LAB — PSA: PSA: 0.26 ng/mL (ref 0.10–4.00)

## 2023-08-12 LAB — HEPATIC FUNCTION PANEL
ALT: 20 U/L (ref 0–53)
AST: 20 U/L (ref 0–37)
Albumin: 4.4 g/dL (ref 3.5–5.2)
Alkaline Phosphatase: 73 U/L (ref 39–117)
Bilirubin, Direct: 0.1 mg/dL (ref 0.0–0.3)
Total Bilirubin: 0.5 mg/dL (ref 0.2–1.2)
Total Protein: 7 g/dL (ref 6.0–8.3)

## 2023-08-12 LAB — HEMOGLOBIN A1C: Hgb A1c MFr Bld: 5.8 % (ref 4.6–6.5)

## 2023-08-12 LAB — TSH: TSH: 0.49 u[IU]/mL (ref 0.35–5.50)

## 2023-08-12 MED ORDER — LISINOPRIL-HYDROCHLOROTHIAZIDE 10-12.5 MG PO TABS
1.0000 | ORAL_TABLET | Freq: Every day | ORAL | 3 refills | Status: DC
Start: 2023-08-12 — End: 2024-09-03

## 2023-08-12 NOTE — Progress Notes (Signed)
Subjective:    Patient ID: Vincent Grant, male    DOB: 1971/04/17, 52 y.o.   MRN: [id number]  HPI Here for a well exam. He feels well in general, but he does mention feeling "cold" frequently. He is not sure why.    Review of Systems  Constitutional: Negative.   HENT: Negative.    Eyes: Negative.   Respiratory: Negative.    Cardiovascular: Negative.   Gastrointestinal: Negative.   Endocrine: Positive for cold intolerance.  Genitourinary: Negative.   Musculoskeletal: Negative.   Skin: Negative.   Neurological: Negative.   Psychiatric/Behavioral: Negative.         Objective:   Physical Exam Constitutional:      General: He is not in acute distress.    Appearance: Normal appearance. He is well-developed. He is not diaphoretic.  HENT:     Head: Normocephalic and atraumatic.     Right Ear: External ear normal.     Left Ear: External ear normal.     Nose: Nose normal.     Mouth/Throat:     Pharynx: No oropharyngeal exudate.  Eyes:     General: No scleral icterus.       Right eye: No discharge.        Left eye: No discharge.     Conjunctiva/sclera: Conjunctivae normal.     Pupils: Pupils are equal, round, and reactive to light.  Neck:     Thyroid: No thyromegaly.     Vascular: No JVD.     Trachea: No tracheal deviation.  Cardiovascular:     Rate and Rhythm: Normal rate and regular rhythm.     Pulses: Normal pulses.     Heart sounds: Normal heart sounds. No murmur heard.    No friction rub. No gallop.  Pulmonary:     Effort: Pulmonary effort is normal. No respiratory distress.     Breath sounds: Normal breath sounds. No wheezing or rales.  Chest:     Chest wall: No tenderness.  Abdominal:     General: Bowel sounds are normal. There is no distension.     Palpations: Abdomen is soft. There is no mass.     Tenderness: There is no abdominal tenderness. There is no guarding or rebound.  Genitourinary:    Penis: Normal. No tenderness.      Testes: Normal.      Prostate: Normal.     Rectum: Normal. Guaiac result negative.  Musculoskeletal:        General: No tenderness. Normal range of motion.     Cervical back: Neck supple.  Lymphadenopathy:     Cervical: No cervical adenopathy.  Skin:    General: Skin is warm and dry.     Coloration: Skin is not pale.     Findings: No erythema or rash.  Neurological:     General: No focal deficit present.     Mental Status: He is alert and oriented to person, place, and time.     Cranial Nerves: No cranial nerve deficit.     Motor: No abnormal muscle tone.     Coordination: Coordination normal.     Deep Tendon Reflexes: Reflexes are normal and symmetric. Reflexes normal.  Psychiatric:        Mood and Affect: Mood normal.        Behavior: Behavior normal.        Thought Content: Thought content normal.        Judgment: Judgment normal.  Assessment & Plan:  Well exam. We discussed diet and exercise. Get fasting labs. Set up another colonoscopy.  Gershon Crane, MD

## 2023-11-11 ENCOUNTER — Telehealth: Payer: Self-pay | Admitting: Family Medicine

## 2023-11-11 NOTE — Telephone Encounter (Signed)
Spoke with referral coodinator Gardiner Sleeper, stated that she opened the referral back up, advised to let pt know to call the GI office to schedule. Pt notified

## 2023-11-11 NOTE — Telephone Encounter (Signed)
Pt states no one ever contacted him from [facility] to make an appointment after see Dr. Clent Ridges in October.  The referral has now expired so the GI office needs another order for a colonoscopy to be done.

## 2023-12-15 ENCOUNTER — Ambulatory Visit (AMBULATORY_SURGERY_CENTER): Payer: 59

## 2023-12-15 VITALS — Ht 74.0 in | Wt 260.0 lb

## 2023-12-15 DIAGNOSIS — Z860100 Personal history of colon polyps, unspecified: Secondary | ICD-10-CM

## 2023-12-15 MED ORDER — NA SULFATE-K SULFATE-MG SULF 17.5-3.13-1.6 GM/177ML PO SOLN: 1.0000 | Freq: Once | ORAL | 0 refills | Status: AC

## 2023-12-15 NOTE — Progress Notes (Signed)
No egg or soy allergy known to patient  No issues known to pt with past sedation with any surgeries or procedures Patient denies ever being told they had issues or difficulty with intubation  No FH of Malignant Hyperthermia Pt is not on diet pills Pt is not on  home 02  OSA uses CPAP Pt is not on blood thinners  Pt denies issues with constipation  No A fib or A flutter Have any cardiac testing pending-- independent  LOA: independent  Prep: sutab   Patient's chart reviewed by Cathlyn Parsons CNRA prior to previsit and patient appropriate for the [facility].  Previsit completed and red dot placed by patient's name on their procedure day (on provider's schedule).     PV competed with patient. Prep instructions sent via mychart and home address.

## 2024-01-05 ENCOUNTER — Encounter: Payer: Self-pay | Admitting: Pediatrics

## 2024-01-09 ENCOUNTER — Encounter: Payer: Self-pay | Admitting: Certified Registered Nurse Anesthetist

## 2024-01-12 NOTE — Progress Notes (Unsigned)
[facility] History and Physical   Primary Care Physician:  Nelwyn Salisbury, MD   Reason for Procedure:  History of colon tubular adenomas  Plan:    Surveillance colonoscopy   HPI: Vincent ROTERT is a 53 y.o. male undergoing surveillance colonoscopy for history of colon tubular adenomas.  Colonoscopy in 2021 demonstrated 4 small polyps-histopathology demonstrated nonadvanced tubular adenomas and hyperplastic polyp.   Past Medical History:  Diagnosis Date   Hypertension    Kidney stones    sees Dr. Wilson Singer   Sleep apnea, obstructive    sees Dr. Craige Cotta    Past Surgical History:  Procedure Laterality Date   COLONOSCOPY  09/24/2020   per Dr. Orvan Falconer, tubular adenomas, repeat in 3 yrs   KIDNEY STONE SURGERY Right 11/29/2009   per Dr. Annabell Howells   TONSILLECTOMY AND ADENOIDECTOMY     adenoids as a child    Prior to Admission medications   Medication Sig Start Date End Date Taking? Authorizing Provider  allopurinol (ZYLOPRIM) 100 MG tablet  01/22/18   [provider]  lisinopril-hydrochlorothiazide (ZESTORETIC) 10-12.5 MG tablet Take 1 tablet by mouth daily. 08/12/23   Nelwyn Salisbury, MD  Potassium Citrate 15 MEQ (1620 MG) TBCR  11/29/17   [provider]    Current Outpatient Medications  Medication Sig Dispense Refill   allopurinol (ZYLOPRIM) 100 MG tablet      lisinopril-hydrochlorothiazide (ZESTORETIC) 10-12.5 MG tablet Take 1 tablet by mouth daily. 90 tablet 3   Potassium Citrate 15 MEQ (1620 MG) TBCR      No current facility-administered medications for this visit.    Allergies as of 01/13/2024   (No Known Allergies)    Family History  Problem Relation Age of Onset   Cancer Son        Ewings sarcoma    Colon polyps Father    Colon cancer Neg Hx    Esophageal cancer Neg Hx    Rectal cancer Neg Hx    Stomach cancer Neg Hx     Social History   Socioeconomic History   Marital status: Married    Spouse name: Not on file   Number of  children: Not on file   Years of education: Not on file   Highest education level: Bachelor's degree (e.g., BA, AB, BS)  Occupational History   Not on file  Tobacco Use   Smoking status: Every Day    Current packs/day: 1.00    Average packs/day: 1 pack/day for 30.0 years (30.0 ttl pk-yrs)    Types: Cigarettes   Smokeless tobacco: Never   Tobacco comments:    1/2 pack each day  Substance and Sexual Activity   Alcohol use: Yes    Alcohol/week: 2.0 standard drinks of alcohol    Types: 2 Shots of liquor per week    Comment: occ   Drug use: No   Sexual activity: Not on file  Other Topics Concern   Not on file  Social History Narrative   Not on file   Social Drivers of Health   Financial Resource Strain: Low Risk  (08/12/2023)   Overall Financial Resource Strain (CARDIA)    Difficulty of Paying Living Expenses: Not hard at all  Food Insecurity: No Food Insecurity (08/12/2023)   Hunger Vital Sign    Worried About Running Out of Food in the Last Year: Never true    Ran Out of Food in the Last Year: Never true  Transportation Needs: No Transportation Needs (08/12/2023)  PRAPARE - Administrator, Civil Service (Medical): No    Lack of Transportation (Non-Medical): No  Physical Activity: Sufficiently Active (08/12/2023)   Exercise Vital Sign    Days of Exercise per Week: 2 days    Minutes of Exercise per Session: 120 min  Stress: No Stress Concern Present (08/12/2023)   [employer] of Occupational Health - Occupational Stress Questionnaire    Feeling of Stress : Only a little  Social Connections: Socially Isolated (08/12/2023)   Social Connection and Isolation Panel [NHANES]    Frequency of Communication with Friends and Family: Once a week    Frequency of Social Gatherings with Friends and Family: Once a week    Attends Religious Services: Never    Database administrator or Organizations: No    Attends Engineer, structural: Not on file    Marital Status:  Married  Catering manager Violence: Not on file    Review of Systems:  All other review of systems negative except as mentioned in the HPI.  Physical Exam: Vital signs There were no vitals taken for this visit.  General:   Alert,  Well-developed, well-nourished, pleasant and cooperative in NAD Airway:  Mallampati  Lungs:  Clear throughout to auscultation.   Heart:  Regular rate and rhythm; no murmurs, clicks, rubs,  or gallops. Abdomen:  Soft, nontender and nondistended. Normal bowel sounds.   Neuro/Psych:  Normal mood and affect. A and O x 3  Maren Beach, MD [facility]

## 2024-01-13 ENCOUNTER — Ambulatory Visit: Payer: 59 | Admitting: Pediatrics

## 2024-01-13 ENCOUNTER — Encounter: Payer: Self-pay | Admitting: Pediatrics

## 2024-01-13 VITALS — BP 121/85 | HR 71 | Temp 98.1°F | Resp 20 | Ht 74.0 in | Wt 260.0 lb

## 2024-01-13 DIAGNOSIS — Z1211 Encounter for screening for malignant neoplasm of colon: Secondary | ICD-10-CM | POA: Diagnosis present

## 2024-01-13 DIAGNOSIS — K621 Rectal polyp: Secondary | ICD-10-CM | POA: Diagnosis not present

## 2024-01-13 DIAGNOSIS — D123 Benign neoplasm of transverse colon: Secondary | ICD-10-CM | POA: Diagnosis not present

## 2024-01-13 DIAGNOSIS — K635 Polyp of colon: Secondary | ICD-10-CM | POA: Diagnosis not present

## 2024-01-13 DIAGNOSIS — Z860100 Personal history of colon polyps, unspecified: Secondary | ICD-10-CM

## 2024-01-13 DIAGNOSIS — K648 Other hemorrhoids: Secondary | ICD-10-CM | POA: Diagnosis not present

## 2024-01-13 DIAGNOSIS — D128 Benign neoplasm of rectum: Secondary | ICD-10-CM | POA: Diagnosis not present

## 2024-01-13 DIAGNOSIS — Z860101 Personal history of adenomatous and serrated colon polyps: Secondary | ICD-10-CM

## 2024-01-13 DIAGNOSIS — K5730 Diverticulosis of large intestine without perforation or abscess without bleeding: Secondary | ICD-10-CM

## 2024-01-13 MED ORDER — SODIUM CHLORIDE 0.9 % IV SOLN: 500.0000 mL | INTRAVENOUS | Status: DC

## 2024-01-13 NOTE — Progress Notes (Signed)
Pt's states no medical or surgical changes since previsit or office visit.

## 2024-01-13 NOTE — Progress Notes (Signed)
Report given to PACU, vss

## 2024-01-13 NOTE — Patient Instructions (Signed)
Handouts provided on polyps, diverticulosis and hemorrhoids.  Resume previous diet.  Continue present medications.  Await pathology results.  Repeat colonoscopy for surveillance based on pathology results.   YOU HAD AN ENDOSCOPIC PROCEDURE TODAY AT THE [facility]:   Refer to the procedure report that was given to you for any specific questions about what was found during the examination.  If the procedure report does not answer your questions, please call your gastroenterologist to clarify.  If you requested that your care partner not be given the details of your procedure findings, then the procedure report has been included in a sealed envelope for you to review at your convenience later.  YOU SHOULD EXPECT: Some feelings of bloating in the abdomen. Passage of more gas than usual.  Walking can help get rid of the air that was put into your GI tract during the procedure and reduce the bloating. If you had a lower endoscopy (such as a colonoscopy or flexible sigmoidoscopy) you may notice spotting of blood in your stool or on the toilet paper. If you underwent a bowel prep for your procedure, you may not have a normal bowel movement for a few days.  Please Note:  You might notice some irritation and congestion in your nose or some drainage.  This is from the oxygen used during your procedure.  There is no need for concern and it should clear up in a day or so.  SYMPTOMS TO REPORT IMMEDIATELY:  Following lower endoscopy (colonoscopy or flexible sigmoidoscopy):  Excessive amounts of blood in the stool  Significant tenderness or worsening of abdominal pains  Swelling of the abdomen that is new, acute  Fever of 100F or higher  For urgent or emergent issues, a gastroenterologist can be reached at any hour by calling [phone]. Do not use MyChart messaging for urgent concerns.    DIET:  We do recommend a small meal at first, but then you may proceed to your regular diet.  Drink  plenty of fluids but you should avoid alcoholic beverages for 24 hours.  ACTIVITY:  You should plan to take it easy for the rest of today and you should NOT DRIVE or use heavy machinery until tomorrow (because of the sedation medicines used during the test).    FOLLOW UP: Our staff will call the number listed on your records the next business day following your procedure.  We will call around 7:15- 8:00 am to check on you and address any questions or concerns that you may have regarding the information given to you following your procedure. If we do not reach you, we will leave a message.     If any biopsies were taken you will be contacted by phone or by letter within the next 1-3 weeks.  Please call us at [phone] if you have not heard about the biopsies in 3 weeks.    SIGNATURES/CONFIDENTIALITY: You and/or your care partner have signed paperwork which will be entered into your electronic medical record.  These signatures attest to the fact that that the information above on your After Visit Summary has been reviewed and is understood.  Full responsibility of the confidentiality of this discharge information lies with you and/or your care-partner.

## 2024-01-13 NOTE — Op Note (Signed)
[facility] Patient Name: Vincent Grant Procedure Date: 01/13/2024 9:21 AM MRN: [id number] Endoscopist: Maren Beach , MD, [id number] Age: 52 Referring MD:  Date of Birth: [birth year] Gender: Male Account #: [id number] Procedure:                Colonoscopy Indications:              High risk colon cancer surveillance: Personal                            history of multiple (3 or more) adenomas, Last                            colonoscopy: October 2021 Medicines:                Monitored Anesthesia Care Procedure:                Pre-Anesthesia Assessment:                           - Prior to the procedure, a History and Physical                            was performed, and patient medications and                            allergies were reviewed. The patient's tolerance of                            previous anesthesia was also reviewed. The risks                            and benefits of the procedure and the sedation                            options and risks were discussed with the patient.                            All questions were answered, and informed consent                            was obtained. Prior Anticoagulants: The patient has                            taken no anticoagulant or antiplatelet agents. ASA                            Grade Assessment: II - A patient with mild systemic                            disease. After reviewing the risks and benefits,                            the patient was deemed in satisfactory condition to  undergo the procedure.                           After obtaining informed consent, the colonoscope                            was passed under direct vision. Throughout the                            procedure, the patient's blood pressure, pulse, and                            oxygen saturations were monitored continuously. The                            CF [id number] #[id number] was introduced through  the anus                            and advanced to the cecum, identified by                            appendiceal orifice and ileocecal valve. The                            colonoscopy was performed without difficulty. The                            patient tolerated the procedure well. The quality                            of the bowel preparation was adequate to identify                            polyps greater than 5 mm in size. The ileocecal                            valve, the appendiceal orifice and the rectum were                            photographed. Scope In: 9:28:00 AM Scope Out: 9:47:58 AM Scope Withdrawal Time: 0 hours 14 minutes 38 seconds  Total Procedure Duration: 0 hours 19 minutes 58 seconds  Findings:                 Hemorrhoids were found on perianal exam.                           The digital rectal exam was normal. Pertinent                            negatives include normal sphincter tone and no                            palpable rectal lesions.  Multiple small-mouthed diverticula were found in                            the sigmoid colon, descending colon, ascending                            colon and cecum.                           Three sessile polyps were found in the rectum (2)                            and transverse colon (1). The polyps were 3 to 4 mm                            in size. These polyps were removed with a cold                            biopsy forceps. Resection and retrieval were                            complete.                           Internal hemorrhoids were found during retroflexion. Complications:            No immediate complications. Estimated blood loss:                            Minimal. Estimated Blood Loss:     Estimated blood loss was minimal. Impression:               - Hemorrhoids found on perianal exam.                           - Diverticulosis in the sigmoid colon, in the                             descending colon, in the ascending colon and in the                            cecum.                           - Three 3 to 4 mm polyps in the rectum (2) and in                            the transverse colon (1), removed with a cold                            biopsy forceps. Resected and retrieved.                           - Internal hemorrhoids. Recommendation:           - Discharge patient to home (  ambulatory).                           - Await pathology results.                           - Repeat colonoscopy for surveillance based on                            pathology results.                           - The findings and recommendations were discussed                            with the patient's family.                           - Return to referring physician.                           - Patient has a contact number available for                            emergencies. The signs and symptoms of potential                            delayed complications were discussed with the                            patient. Return to normal activities tomorrow.                            Written discharge instructions were provided to the                            patient. Maren Beach, MD 01/13/2024 9:52:42 AM This report has been signed electronically.

## 2024-01-13 NOTE — Progress Notes (Signed)
Called to room to assist during endoscopic procedure.  Patient ID and intended procedure confirmed with present staff. Received instructions for my participation in the procedure from the performing physician.

## 2024-01-14 HISTORY — PX: COLONOSCOPY: SHX174

## 2024-01-16 ENCOUNTER — Telehealth: Payer: Self-pay

## 2024-01-16 NOTE — Telephone Encounter (Signed)
  Follow up Call-     01/13/2024    8:54 AM  Call back number  Post procedure Call Back phone  # [phone]  Permission to leave phone message Yes     Patient questions:  Do you have a fever, pain , or abdominal swelling? No. Pain Score  0 *  Have you tolerated food without any problems? Yes.    Have you been able to return to your normal activities? Yes.    Do you have any questions about your discharge instructions: Diet   No. Medications  No. Follow up visit  No.  Do you have questions or concerns about your Care? No. Patient states that that had ran a 101 temp after procedure and called the number provided. Felt it was a viral illness. States he is feeling better today and fever free this morning. Instructed to call back with any questions or concerns.  Actions: * If pain score is 4 or above: No action needed, pain <4.

## 2024-01-17 ENCOUNTER — Encounter: Payer: Self-pay | Admitting: Pediatrics

## 2024-01-17 LAB — SURGICAL PATHOLOGY

## 2024-02-01 ENCOUNTER — Telehealth: Payer: Self-pay | Admitting: Nurse Practitioner

## 2024-02-01 NOTE — Telephone Encounter (Signed)
 Faxed back 02/01/24 confirmation received

## 2024-08-17 ENCOUNTER — Ambulatory Visit (INDEPENDENT_AMBULATORY_CARE_PROVIDER_SITE_OTHER): Admitting: Family Medicine

## 2024-08-17 ENCOUNTER — Encounter: Payer: Self-pay | Admitting: Family Medicine

## 2024-08-17 ENCOUNTER — Ambulatory Visit: Payer: Self-pay | Admitting: Family Medicine

## 2024-08-17 VITALS — BP 100/74 | HR 79 | Temp 98.1°F | Ht 73.0 in | Wt 261.0 lb

## 2024-08-17 DIAGNOSIS — R1319 Other dysphagia: Secondary | ICD-10-CM | POA: Diagnosis not present

## 2024-08-17 DIAGNOSIS — Z0000 Encounter for general adult medical examination without abnormal findings: Secondary | ICD-10-CM | POA: Diagnosis not present

## 2024-08-17 LAB — PSA: PSA: 0.24 ng/mL (ref 0.10–4.00)

## 2024-08-17 LAB — TSH: TSH: 0.49 u[IU]/mL (ref 0.35–5.50)

## 2024-08-17 LAB — LIPID PANEL
Cholesterol: 165 mg/dL (ref 0–200)
HDL: 29.8 mg/dL — ABNORMAL LOW (ref >39.00)
LDL Cholesterol: 92 mg/dL (ref 0–99)
NonHDL: 135.37
Total CHOL/HDL Ratio: 6
Triglycerides: 217 mg/dL — ABNORMAL HIGH (ref 0.0–149.0)
VLDL: 43.4 mg/dL — ABNORMAL HIGH (ref 0.0–40.0)

## 2024-08-17 LAB — CBC WITH DIFFERENTIAL/PLATELET
Basophils Absolute: 0 K/uL (ref 0.0–0.1)
Basophils Relative: 0.6 % (ref 0.0–3.0)
Eosinophils Absolute: 0.1 K/uL (ref 0.0–0.7)
Eosinophils Relative: 1.8 % (ref 0.0–5.0)
HCT: 46.8 % (ref 39.0–52.0)
Hemoglobin: 15.7 g/dL (ref 13.0–17.0)
Lymphocytes Relative: 33 % (ref 12.0–46.0)
Lymphs Abs: 1.9 K/uL (ref 0.7–4.0)
MCHC: 33.5 g/dL (ref 30.0–36.0)
MCV: 87.4 fl (ref 78.0–100.0)
Monocytes Absolute: 0.5 K/uL (ref 0.1–1.0)
Monocytes Relative: 9 % (ref 3.0–12.0)
Neutro Abs: 3.3 K/uL (ref 1.4–7.7)
Neutrophils Relative %: 55.6 % (ref 43.0–77.0)
Platelets: 183 K/uL (ref 150.0–400.0)
RBC: 5.36 Mil/uL (ref 4.22–5.81)
RDW: 13.6 % (ref 11.5–15.5)
WBC: 5.8 K/uL (ref 4.0–10.5)

## 2024-08-17 LAB — HEMOGLOBIN A1C: Hgb A1c MFr Bld: 6.1 % (ref 4.6–6.5)

## 2024-08-17 LAB — BASIC METABOLIC PANEL WITH GFR
BUN: 21 mg/dL (ref 6–23)
CO2: 29 meq/L (ref 19–32)
Calcium: 9.6 mg/dL (ref 8.4–10.5)
Chloride: 104 meq/L (ref 96–112)
Creatinine, Ser: 0.97 mg/dL (ref 0.40–1.50)
GFR: 88.16 mL/min (ref >60.00)
Glucose, Bld: 95 mg/dL (ref 70–99)
Potassium: 4.1 meq/L (ref 3.5–5.1)
Sodium: 141 meq/L (ref 135–145)

## 2024-08-17 LAB — HEPATIC FUNCTION PANEL
ALT: 21 U/L (ref 0–53)
AST: 21 U/L (ref 0–37)
Albumin: 4.7 g/dL (ref 3.5–5.2)
Alkaline Phosphatase: 73 U/L (ref 39–117)
Bilirubin, Direct: 0.1 mg/dL (ref 0.0–0.3)
Total Bilirubin: 0.5 mg/dL (ref 0.2–1.2)
Total Protein: 7.2 g/dL (ref 6.0–8.3)

## 2024-08-17 NOTE — Progress Notes (Signed)
 Subjective:    Patient ID: Vincent Grant, male    DOB: 04/21/1971, 53 y.o.   MRN: [id number]  HPI Here for a well exam. His BP has been stable. He has one concern today, and this started a few months ago. About once a week while he is eating, he feels like the food stops partway down and hangs here for a few minutes before it eventually goes on down. This is no painful. No nausea or choking. He very rarely has heartburn.    Review of Systems  Constitutional: Negative.   HENT:  Positive for trouble swallowing.   Eyes: Negative.   Respiratory: Negative.    Cardiovascular: Negative.   Gastrointestinal: Negative.   Genitourinary: Negative.   Musculoskeletal: Negative.   Skin: Negative.   Neurological: Negative.   Psychiatric/Behavioral: Negative.         Objective:   Physical Exam Constitutional:      General: He is not in acute distress.    Appearance: Normal appearance. He is well-developed. He is not diaphoretic.  HENT:     Head: Normocephalic and atraumatic.     Right Ear: External ear normal.     Left Ear: External ear normal.     Nose: Nose normal.     Mouth/Throat:     Pharynx: No oropharyngeal exudate.  Eyes:     General: No scleral icterus.       Right eye: No discharge.        Left eye: No discharge.     Conjunctiva/sclera: Conjunctivae normal.     Pupils: Pupils are equal, round, and reactive to light.  Neck:     Thyroid : No thyromegaly.     Vascular: No JVD.     Trachea: No tracheal deviation.  Cardiovascular:     Rate and Rhythm: Normal rate and regular rhythm.     Pulses: Normal pulses.     Heart sounds: Normal heart sounds. No murmur heard.    No friction rub. No gallop.  Pulmonary:     Effort: Pulmonary effort is normal. No respiratory distress.     Breath sounds: Normal breath sounds. No wheezing or rales.  Chest:     Chest wall: No tenderness.  Abdominal:     General: Bowel sounds are normal. There is no distension.     Palpations: Abdomen is  soft. There is no mass.     Tenderness: There is no abdominal tenderness. There is no guarding or rebound.  Genitourinary:    Penis: Normal. No tenderness.      Testes: Normal.     Prostate: Normal.     Rectum: Normal. Guaiac result negative.  Musculoskeletal:        General: No tenderness. Normal range of motion.     Cervical back: Neck supple.  Lymphadenopathy:     Cervical: No cervical adenopathy.  Skin:    General: Skin is warm and dry.     Coloration: Skin is not pale.     Findings: No erythema or rash.  Neurological:     General: No focal deficit present.     Mental Status: He is alert and oriented to person, place, and time.     Cranial Nerves: No cranial nerve deficit.     Motor: No abnormal muscle tone.     Coordination: Coordination normal.     Deep Tendon Reflexes: Reflexes are normal and symmetric. Reflexes normal.  Psychiatric:        Mood and Affect: Mood  normal.        Behavior: Behavior normal.        Thought Content: Thought content normal.        Judgment: Judgment normal.           Assessment & Plan:  Well exam. We discussed diet and exercise. Get fasting labs. For the dysphagia, we will refer him to GI. Garnette Olmsted, MD

## 2024-08-20 NOTE — Telephone Encounter (Signed)
 Spoke with pt wife aware that pt form is ready for pick up at the front office. Placed in the filing cabinet at the front and a copy sent to scanning

## 2024-09-02 ENCOUNTER — Other Ambulatory Visit: Payer: Self-pay | Admitting: Family Medicine
